# Patient Record
Sex: Female | Born: 1935 | Race: White | Hispanic: No | Smoking: Former smoker
Health system: Southern US, Community
[De-identification: ages and names within clinical notes are randomized; demographics above are authoritative.]

## PROBLEM LIST (undated history)

## (undated) HISTORY — PX: TONSILLECTOMY: SUR1361

---

## 1996-01-12 LAB — HM COLONOSCOPY: HM Colonoscopy: NORMAL

## 2005-01-11 HISTORY — PX: PACEMAKER INSERTION: SHX728

## 2005-01-11 HISTORY — PX: HIP PINNING: SHX1757

## 2006-10-18 ENCOUNTER — Ambulatory Visit: Payer: Self-pay | Admitting: Internal Medicine

## 2006-10-19 ENCOUNTER — Encounter: Payer: Self-pay | Admitting: Internal Medicine

## 2006-11-01 ENCOUNTER — Other Ambulatory Visit: Payer: Self-pay

## 2006-11-01 ENCOUNTER — Inpatient Hospital Stay: Payer: Self-pay | Admitting: Internal Medicine

## 2006-11-02 ENCOUNTER — Other Ambulatory Visit: Payer: Self-pay

## 2006-11-04 ENCOUNTER — Other Ambulatory Visit: Payer: Self-pay

## 2006-12-25 ENCOUNTER — Other Ambulatory Visit: Payer: Self-pay

## 2006-12-25 ENCOUNTER — Inpatient Hospital Stay: Payer: Self-pay | Admitting: Internal Medicine

## 2006-12-26 ENCOUNTER — Other Ambulatory Visit: Payer: Self-pay

## 2006-12-29 ENCOUNTER — Other Ambulatory Visit: Payer: Self-pay

## 2007-01-16 ENCOUNTER — Ambulatory Visit: Payer: Self-pay | Admitting: Cardiology

## 2007-02-01 ENCOUNTER — Ambulatory Visit: Payer: Self-pay | Admitting: Internal Medicine

## 2007-08-03 ENCOUNTER — Ambulatory Visit: Payer: Self-pay | Admitting: Internal Medicine

## 2008-02-15 ENCOUNTER — Ambulatory Visit: Payer: Self-pay | Admitting: Internal Medicine

## 2008-10-07 ENCOUNTER — Encounter: Payer: Self-pay | Admitting: Unknown Physician Specialty

## 2008-10-11 ENCOUNTER — Encounter: Payer: Self-pay | Admitting: Unknown Physician Specialty

## 2008-10-22 IMAGING — CT CT CHEST W/ CM
1 series · 16 of 32 positions shown, 20 images · IV contrast (agent unspecified)
Comparison: none

REASON FOR EXAM: hemoptysis
COMMENTS:

PROCEDURE:     CT  - CT CHEST WITH CONTRAST  - August 03, 2007  [DATE]
RESULT:
HISTORY: Hemoptysis.

[Series 2: soft tissue · axial · 0.54mm/px · z∈[-274,+10]mm · 16 of 65 slices shown, 20 images]
[im 5/65  soft-tissue]
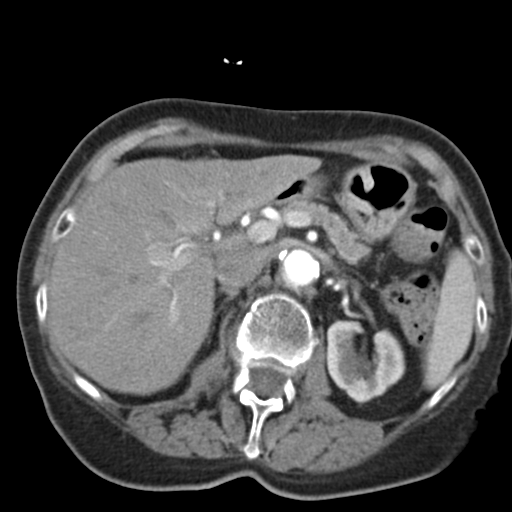
[im 5/65  bone]
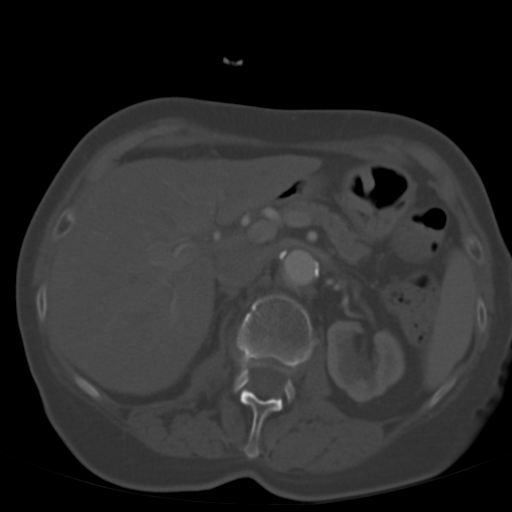
[im 9/65  soft-tissue]
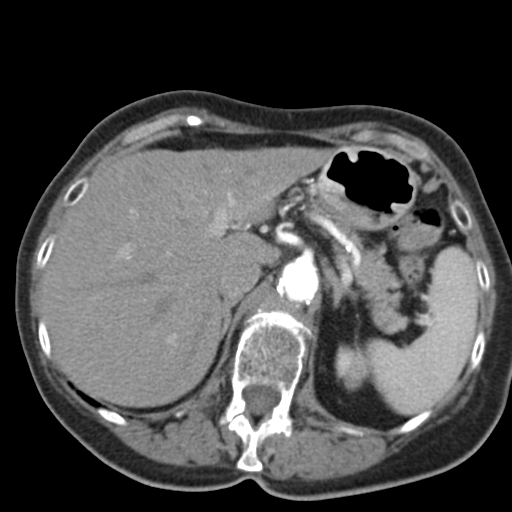
[im 13/65  soft-tissue]
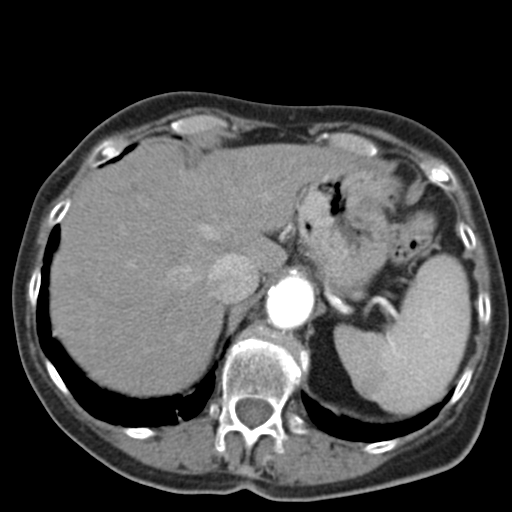
[im 17/65  soft-tissue]
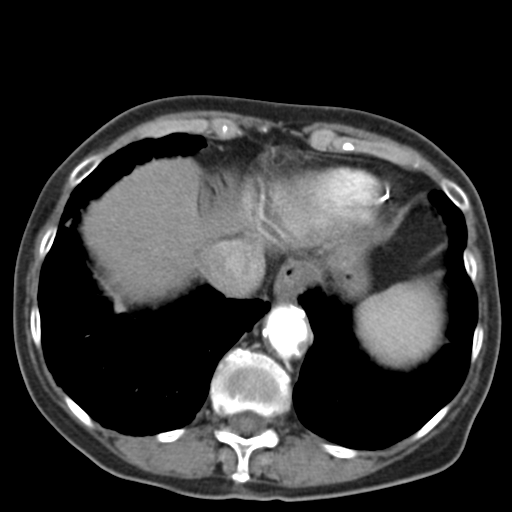
[im 21/65  soft-tissue]
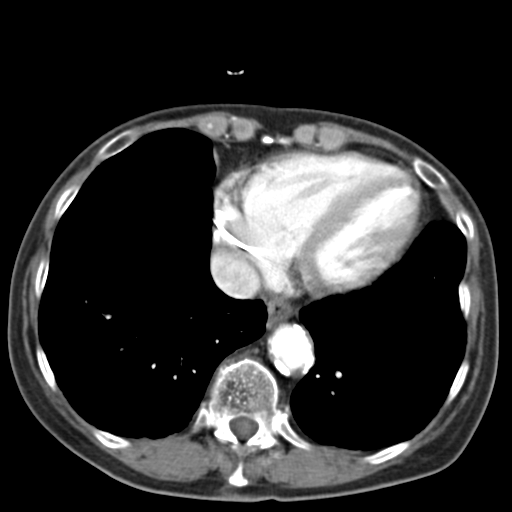
[im 25/65  soft-tissue]
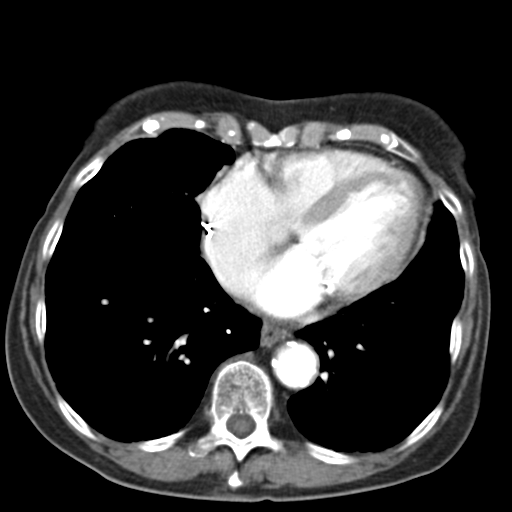
[im 29/65  soft-tissue]
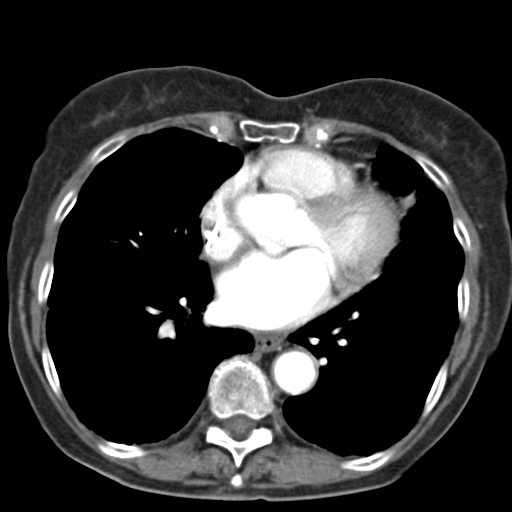
[im 36/65  soft-tissue]
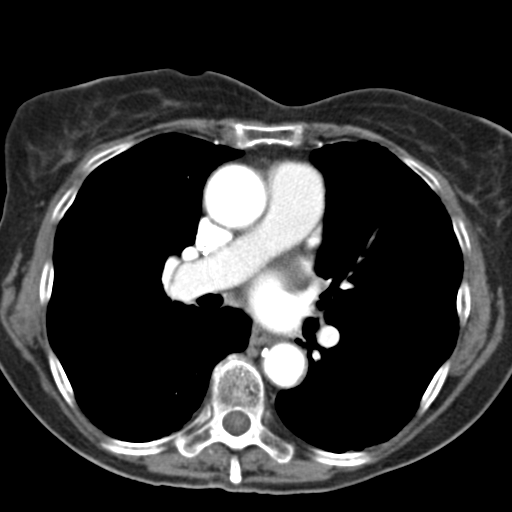
[im 40/65  soft-tissue]
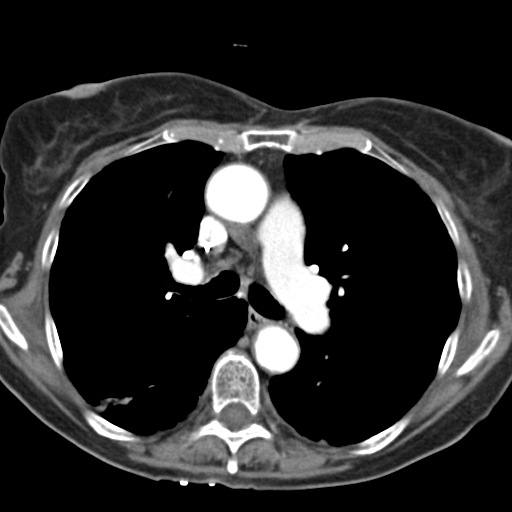
[im 40/65  bone]
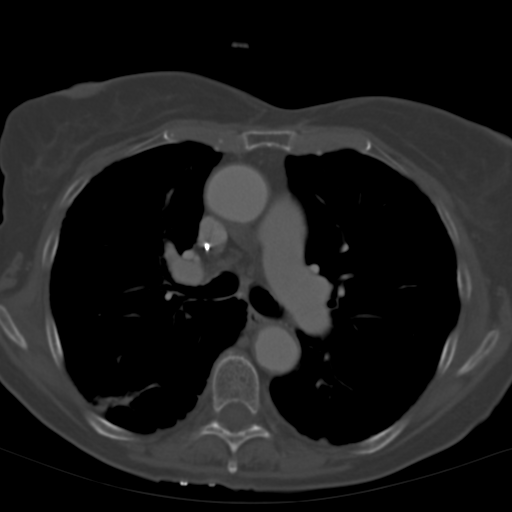
[im 44/65  soft-tissue]
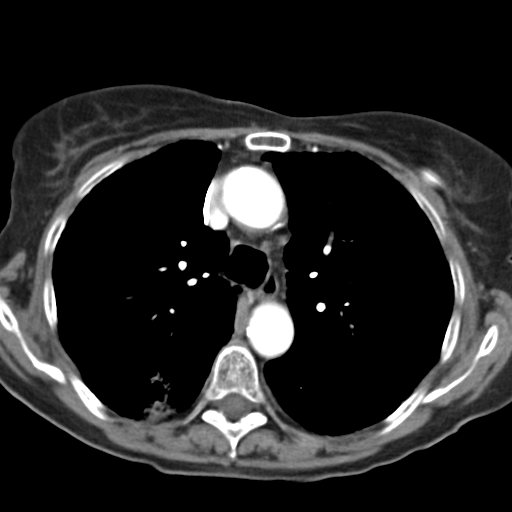
[im 48/65  soft-tissue]
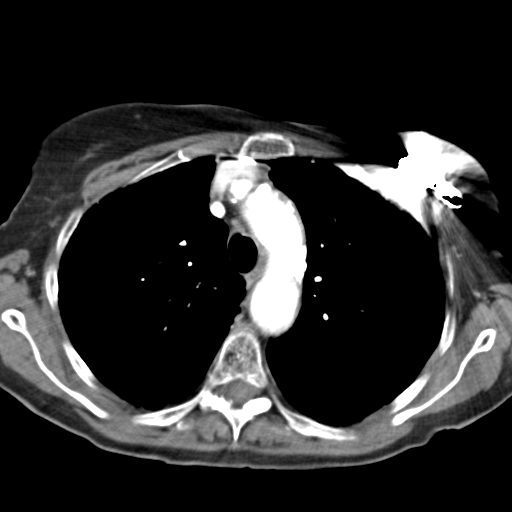
[im 52/65  soft-tissue]
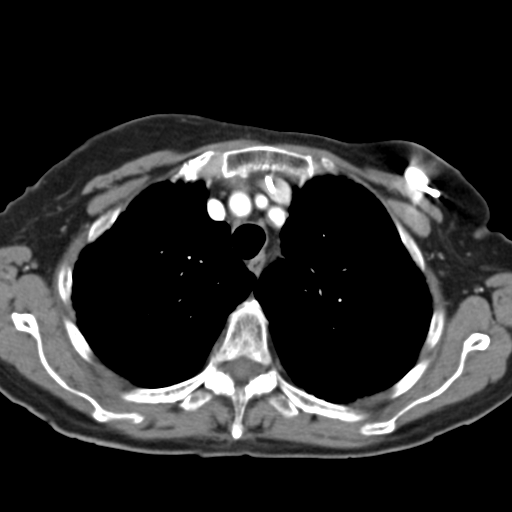
[im 56/65  soft-tissue]
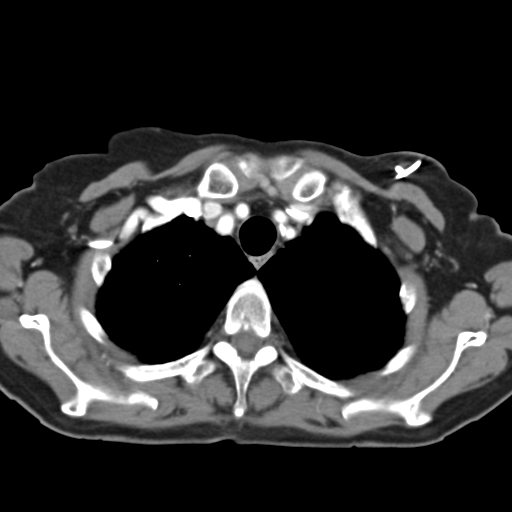
[im 56/65  lung]
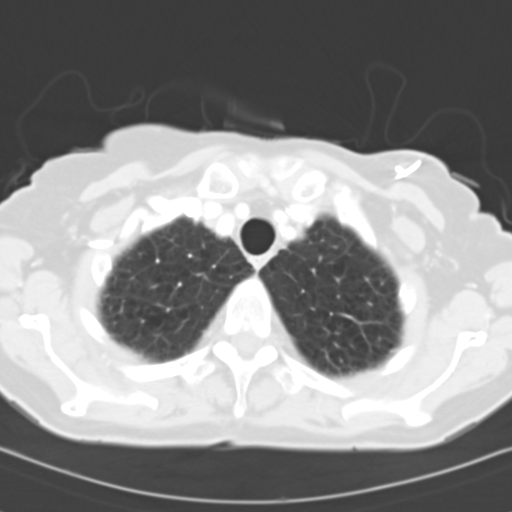
[im 58/65  lung]
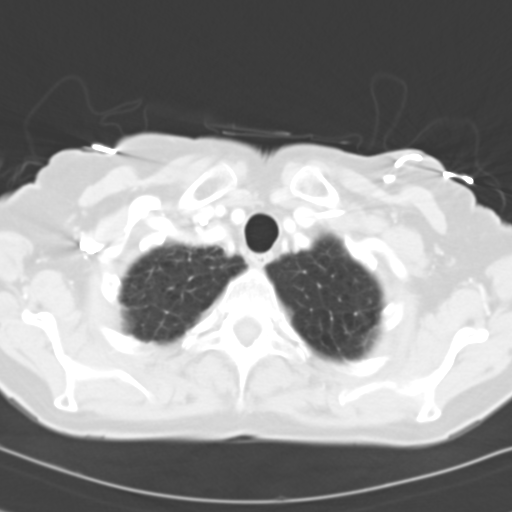
[im 60/65  soft-tissue]
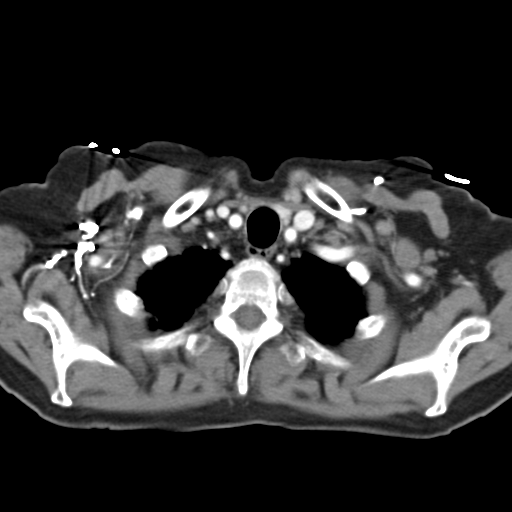
[im 60/65  lung]
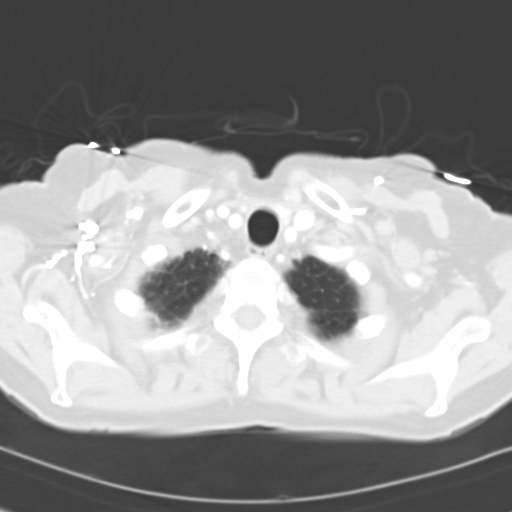
[im 62/65  lung]
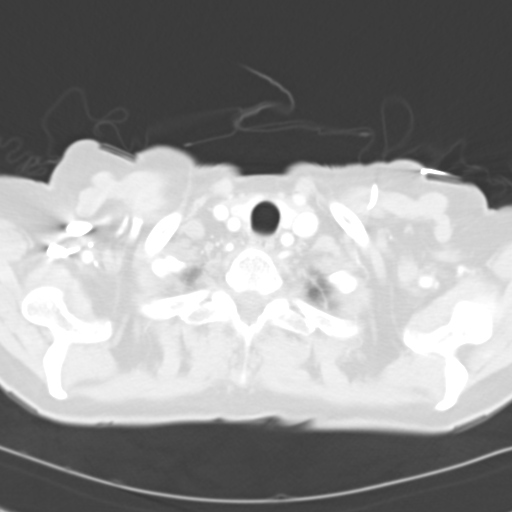

[16 of 32 positions shown; findings below may reference images not displayed]

COMPARISON STUDIES: Chest CT of 10/18/06.

PROCEDURE AND FINDINGS: Again noted are changes of bullous COPD.  Pleural
parenchymal thickening is noted consistent with scarring. Prominent cystic
change with adjacent interstitial prominence is again noted in the RIGHT
upper lobe. These changes are most consistent with bullous disease and
scarring. The changes are stable from 10/18/06. The thoracic aorta is
atherosclerotic.  Cardiac pacer noted with lead tips in the RIGHT ventricle.
The pulmonary arteries are normal.
IMPRESSION: 1.     No evidence of pulmonary embolus.
2.     Bullous COPD with stable changes of scarring in the RIGHT upper lobe.
Chest CT is stable from 10/18/06.
3.     A cardiac pacer is noted.

## 2008-11-11 ENCOUNTER — Encounter: Payer: Self-pay | Admitting: Unknown Physician Specialty

## 2008-12-11 ENCOUNTER — Encounter: Payer: Self-pay | Admitting: Unknown Physician Specialty

## 2009-01-11 ENCOUNTER — Encounter: Payer: Self-pay | Admitting: Unknown Physician Specialty

## 2009-02-11 ENCOUNTER — Encounter: Payer: Self-pay | Admitting: Unknown Physician Specialty

## 2009-05-20 ENCOUNTER — Ambulatory Visit: Payer: Self-pay | Admitting: Internal Medicine

## 2009-07-11 LAB — HM MAMMOGRAPHY: HM Mammogram: NORMAL

## 2009-12-08 ENCOUNTER — Emergency Department: Payer: Self-pay | Admitting: Emergency Medicine

## 2010-05-05 ENCOUNTER — Ambulatory Visit: Payer: Medicare Other | Admitting: Internal Medicine

## 2010-05-21 ENCOUNTER — Ambulatory Visit: Payer: Medicare Other | Admitting: Cardiothoracic Surgery

## 2010-05-21 ENCOUNTER — Ambulatory Visit: Payer: Medicare Other | Admitting: Specialist

## 2010-06-03 ENCOUNTER — Institutional Professional Consult (permissible substitution): Payer: Self-pay | Admitting: Emergency Medicine

## 2010-06-12 ENCOUNTER — Ambulatory Visit: Payer: Medicare Other | Admitting: Cardiothoracic Surgery

## 2010-06-17 ENCOUNTER — Ambulatory Visit: Payer: Medicare Other | Admitting: Oncology

## 2010-07-12 ENCOUNTER — Ambulatory Visit: Payer: Medicare Other | Admitting: Cardiothoracic Surgery

## 2010-07-16 ENCOUNTER — Encounter: Payer: Medicare Other | Admitting: Physical Medicine & Rehabilitation

## 2010-07-20 ENCOUNTER — Encounter: Payer: Self-pay | Admitting: Family Medicine

## 2010-07-20 ENCOUNTER — Ambulatory Visit (INDEPENDENT_AMBULATORY_CARE_PROVIDER_SITE_OTHER): Payer: Medicare Other | Admitting: Family Medicine

## 2010-07-20 DIAGNOSIS — D509 Iron deficiency anemia, unspecified: Secondary | ICD-10-CM | POA: Insufficient documentation

## 2010-07-20 DIAGNOSIS — M810 Age-related osteoporosis without current pathological fracture: Secondary | ICD-10-CM | POA: Insufficient documentation

## 2010-07-20 DIAGNOSIS — E039 Hypothyroidism, unspecified: Secondary | ICD-10-CM | POA: Insufficient documentation

## 2010-07-20 DIAGNOSIS — Z9289 Personal history of other medical treatment: Secondary | ICD-10-CM | POA: Insufficient documentation

## 2010-07-20 DIAGNOSIS — I6350 Cerebral infarction due to unspecified occlusion or stenosis of unspecified cerebral artery: Secondary | ICD-10-CM

## 2010-07-20 DIAGNOSIS — E538 Deficiency of other specified B group vitamins: Secondary | ICD-10-CM | POA: Insufficient documentation

## 2010-07-20 DIAGNOSIS — I4891 Unspecified atrial fibrillation: Secondary | ICD-10-CM | POA: Insufficient documentation

## 2010-07-20 DIAGNOSIS — D649 Anemia, unspecified: Secondary | ICD-10-CM

## 2010-07-20 DIAGNOSIS — IMO0002 Reserved for concepts with insufficient information to code with codable children: Secondary | ICD-10-CM

## 2010-07-20 DIAGNOSIS — N390 Urinary tract infection, site not specified: Secondary | ICD-10-CM

## 2010-07-20 DIAGNOSIS — I2510 Atherosclerotic heart disease of native coronary artery without angina pectoris: Secondary | ICD-10-CM | POA: Insufficient documentation

## 2010-07-20 DIAGNOSIS — I10 Essential (primary) hypertension: Secondary | ICD-10-CM | POA: Insufficient documentation

## 2010-07-20 DIAGNOSIS — I639 Cerebral infarction, unspecified: Secondary | ICD-10-CM | POA: Insufficient documentation

## 2010-07-20 DIAGNOSIS — H353290 Exudative age-related macular degeneration, unspecified eye, stage unspecified: Secondary | ICD-10-CM

## 2010-07-20 DIAGNOSIS — E7800 Pure hypercholesterolemia, unspecified: Secondary | ICD-10-CM

## 2010-07-20 DIAGNOSIS — C3490 Malignant neoplasm of unspecified part of unspecified bronchus or lung: Secondary | ICD-10-CM

## 2010-07-20 NOTE — Assessment & Plan Note (Signed)
Not evaluated in long time. Was previously on fosamax for years, taken off by MD.,

## 2010-07-20 NOTE — Assessment & Plan Note (Signed)
On aspirin daily. Likely high risk due to cancer.

## 2010-07-20 NOTE — Assessment & Plan Note (Signed)
Rate controlled on BBlocker. 

## 2010-07-20 NOTE — Progress Notes (Signed)
  Subjective:    Patient ID: Kristin Cantrell, female    DOB: 1921/08/31, 77 y.o.   MRN: [id number]  HPI 77 year old female here to establish new PCP.Marland Kitchen Previous MD at [facility].  Dx with lung cancer in 05/2010.Marland KitchenMarland KitchenSeeing Dr. Meredeth Ide Pulmonologist.  Now undergoing radiation only...radiation oncologist: Dr Aggie Cosier. Not surgical candidate but surgery also likely not necessary. On oxygen 2 Liters at night.  Had needed bx of mass on 5/10. Also had shot for wet macular degeneration on 5/11 Later that night  5/11 she had CVA... Admitted to [facility]. Status post rehab.  Since the stroke she has had some residual left foot weakness and some mild confusion.  Placed back on cholesterol medication when in hospital (had been taken off in past due to myalgia) No current SE to 20 mg  Simvastatin daily. Last LDL  IN 2000 she had MI. In 2007 had Atrial Fibrillation.. Pacemaker placed. (In past on amiodarone, but did not tolerate this) Dr Lady Gary in her cardiologist, next appt in July, sees him every 6 months. HTN, well controlled on atenolol.  Walks with walker   Review of Systems  Constitutional: Positive for fatigue. Negative for fever.  HENT: Negative for congestion.   Respiratory: Positive for shortness of breath. Negative for cough and wheezing.   Cardiovascular: Negative for chest pain and leg swelling.  Gastrointestinal: Positive for constipation. Negative for diarrhea.  Genitourinary: Negative for dysuria.  Musculoskeletal: Positive for back pain.  Skin: Negative for rash.  Neurological: Positive for light-headedness.       Associated with radiation       Objective:   Physical Exam  Constitutional: She is oriented to person, place, and time.       Elderly female in NAD   HENT:  Head: Normocephalic.  Right Ear: External ear normal.  Left Ear: External ear normal.  Eyes: Conjunctivae and EOM are normal. Pupils are equal, round, and reactive to light.  Neck: Normal range of motion.  Neck supple. No thyromegaly present.  Cardiovascular: Normal rate, S1 normal and S2 normal.  An irregular rhythm present. PMI is not displaced.  Exam reveals distant heart sounds. Exam reveals no friction rub.   No murmur heard.      B varicose veins, no peripheral swelling  Pulmonary/Chest: Effort normal. No respiratory distress. She has decreased breath sounds in the right upper field. She has no wheezes. She has no rhonchi. She has no rales.  Abdominal: Soft. Normal appearance and normal aorta. There is no hepatosplenomegaly. There is no tenderness. There is no CVA tenderness.  Musculoskeletal:       Slowed gait, walking with walker  Lymphadenopathy:    She has no cervical adenopathy.  Neurological: She is alert and oriented to person, place, and time. No cranial nerve deficit or sensory deficit. She exhibits normal muscle tone.  Skin: Skin is warm, dry and intact.  Psychiatric: She has a normal mood and affect. Her speech is normal and behavior is normal. Judgment and thought content normal. She exhibits abnormal recent memory. She exhibits normal remote memory.          Assessment & Plan:

## 2010-07-20 NOTE — Assessment & Plan Note (Signed)
Followed by Dr. Meredeth Ide and radiation oncology.  Plan is radiation only.

## 2010-07-20 NOTE — Patient Instructions (Addendum)
It was nice meeting you today. Restart calcium and vit D. Okay to use B complex if interested. Work on healthy eating rest and push fluids.

## 2010-07-20 NOTE — Assessment & Plan Note (Signed)
Stable per Dr. Lady Gary, cards. Will get records.

## 2010-07-20 NOTE — Assessment & Plan Note (Signed)
Back on simvastatin low dose. Due for re-eval in end of August.  Goal LDL <70.

## 2010-07-20 NOTE — Assessment & Plan Note (Signed)
Well-controlled on atenolol. 

## 2010-07-20 NOTE — Assessment & Plan Note (Signed)
Likely last checked in hospital. Get records for last eval.

## 2010-07-21 ENCOUNTER — Other Ambulatory Visit: Payer: Self-pay | Admitting: *Deleted

## 2010-07-21 MED ORDER — SULFAMETHOXAZOLE-TRIMETHOPRIM 400-80 MG PO TABS: 1.0000 | ORAL_TABLET | Freq: Every day | ORAL | Status: DC

## 2010-07-21 NOTE — Telephone Encounter (Signed)
Pt was seen yesterday as a new patient and forgot to tell you that she needs a refill on bactrim.  She takes this every night to prevent UTI's.  Uses [facility] in [place].

## 2010-07-21 NOTE — Telephone Encounter (Signed)
Bactrim regular strength

## 2010-07-21 NOTE — Telephone Encounter (Signed)
Before I refill this please verify the dose... Is it Bactrim regualr or DS... Med list says DS but DS not usually used for prophylaxisis.  Call pt daughter or pharm to verify.

## 2010-08-07 ENCOUNTER — Other Ambulatory Visit: Payer: Self-pay | Admitting: Family Medicine

## 2010-08-07 DIAGNOSIS — E039 Hypothyroidism, unspecified: Secondary | ICD-10-CM

## 2010-08-07 NOTE — Assessment & Plan Note (Signed)
T4 1.44 in hopsital.. Will recheck.

## 2010-08-12 ENCOUNTER — Ambulatory Visit: Payer: Medicare Other | Admitting: Cardiothoracic Surgery

## 2010-08-21 ENCOUNTER — Other Ambulatory Visit (INDEPENDENT_AMBULATORY_CARE_PROVIDER_SITE_OTHER): Payer: Medicare Other

## 2010-08-21 DIAGNOSIS — E039 Hypothyroidism, unspecified: Secondary | ICD-10-CM

## 2010-08-21 DIAGNOSIS — E7800 Pure hypercholesterolemia, unspecified: Secondary | ICD-10-CM

## 2010-08-21 LAB — COMPREHENSIVE METABOLIC PANEL
ALT: 11 U/L (ref 0–35)
Albumin: 3.9 g/dL (ref 3.5–5.2)
Alkaline Phosphatase: 57 U/L (ref 39–117)
Glucose, Bld: 91 mg/dL (ref 70–99)
Potassium: 4.4 mEq/L (ref 3.5–5.1)
Sodium: 142 mEq/L (ref 135–145)
Total Protein: 6.6 g/dL (ref 6.0–8.3)

## 2010-08-21 LAB — LIPID PANEL: VLDL: 21.6 mg/dL (ref 0.0–40.0)

## 2010-08-21 LAB — TSH: TSH: 1.21 u[IU]/mL (ref 0.35–5.50)

## 2010-08-28 ENCOUNTER — Ambulatory Visit: Payer: Self-pay | Admitting: Family Medicine

## 2010-08-28 ENCOUNTER — Ambulatory Visit (INDEPENDENT_AMBULATORY_CARE_PROVIDER_SITE_OTHER): Payer: Medicare Other | Admitting: Family Medicine

## 2010-08-28 ENCOUNTER — Encounter: Payer: Self-pay | Admitting: Family Medicine

## 2010-08-28 DIAGNOSIS — E7800 Pure hypercholesterolemia, unspecified: Secondary | ICD-10-CM

## 2010-08-28 DIAGNOSIS — C3490 Malignant neoplasm of unspecified part of unspecified bronchus or lung: Secondary | ICD-10-CM

## 2010-08-28 DIAGNOSIS — N390 Urinary tract infection, site not specified: Secondary | ICD-10-CM

## 2010-08-28 DIAGNOSIS — I10 Essential (primary) hypertension: Secondary | ICD-10-CM

## 2010-08-28 NOTE — Patient Instructions (Addendum)
Call if appetite not improving in 1 month or more if weight loss continuing or appetitie remains poor. Consider remeron to help with appetite. Continue PT at hospital, work on home exercises as able.

## 2010-08-28 NOTE — Assessment & Plan Note (Signed)
Almost at goal LDL ,70, pt does not wish to be aggressive. Will continue current lovastatin low dose.

## 2010-08-28 NOTE — Progress Notes (Signed)
Subjective:    Patient ID: Kristin Cantrell, female    DOB: 1921/09/17, 77 y.o.   MRN: [id number]  HPI  Dx with lung cancer in 05/2010.Marland KitchenMarland KitchenSeeing Dr. Meredeth Ide Pulmonologist.  Now undergoing radiation only...radiation oncologist: Dr Aggie Cosier.  Not surgical candidate but surgery also likely not necessary.  On oxygen 2 Liters at night.  She did have her last rediation treatment today.. Tolerating fairly well. Stable SOB.  She has been having poor appetitie. Still trys to eat. Weight has dropped in last 7 lbs.  Sh wants to minimize meds as much as possible.  Had needed bx of mass on 5/10.  Also had shot for wet macular degeneration on 5/11  Later that night 5/11 she had CVA... Admitted to [facility].  Status post rehab.  Since the stroke she has had some residual left foot weakness and some mild confusion.   Placed back on cholesterol medication when in hospital (had been taken off in past due to myalgia) No current SE to 20 mg Simvastatin daily.  LDL almost at goal <70 on recent tests 08/2010  IN 2000 she had MI.  In 2007 had Atrial Fibrillation.. Pacemaker placed. (In past on amiodarone, but did not tolerate this)  Dr Lady Gary in her cardiologist, next appt in July, sees him every 6 months.   HTN, well controlled on atenolol.  Using medication without problems or lightheadedness:  Chest pain with exertion: None Edema:None Short of breath:Stable. Average home BPs: Other issues:  Hypothyroid: Lab Results  Component Value Date   TSH 1.21 08/21/2010         Review of Systems  Constitutional: Negative for fever and fatigue.  HENT: Positive for ear pain. Negative for ear discharge.        Right occ intermittant in past  few months  Eyes: Negative for pain.  Respiratory: Negative for chest tightness and shortness of breath.   Cardiovascular: Negative for chest pain, palpitations and leg swelling.  Gastrointestinal: Negative for abdominal pain.  Genitourinary: Negative for dysuria.       Objective:   Physical Exam  Constitutional: Vital signs are normal. She appears well-developed and well-nourished. She is cooperative.  Non-toxic appearance. She does not appear ill. No distress.       Elderly, in wheelchair  HENT:  Head: Normocephalic.  Right Ear: Hearing, tympanic membrane, external ear and ear canal normal. Tympanic membrane is not erythematous, not retracted and not bulging.  Left Ear: Hearing, tympanic membrane, external ear and ear canal normal. Tympanic membrane is not erythematous, not retracted and not bulging.  Nose: No mucosal edema or rhinorrhea. Right sinus exhibits no maxillary sinus tenderness and no frontal sinus tenderness. Left sinus exhibits no maxillary sinus tenderness and no frontal sinus tenderness.  Mouth/Throat: Uvula is midline, oropharynx is clear and moist and mucous membranes are normal.       Right TM occluded with cerumen.Murlean Caller and exam reperformed  Eyes: Conjunctivae, EOM and lids are normal. Pupils are equal, round, and reactive to light. No foreign bodies found.  Neck: Trachea normal and normal range of motion. Neck supple. Carotid bruit is not present. No mass and no thyromegaly present.  Cardiovascular: Normal rate, regular rhythm, S1 normal, S2 normal, intact distal pulses and normal pulses.  Exam reveals distant heart sounds. Exam reveals no gallop and no friction rub.   No murmur heard. Pulmonary/Chest: Effort normal and breath sounds normal. Not tachypneic. No respiratory distress. She has no decreased breath sounds. She has no wheezes. She  has no rhonchi. She has no rales.  Abdominal: Soft. Normal appearance and bowel sounds are normal. There is no tenderness.  Neurological: She is alert.  Skin: Skin is warm, dry and intact. No rash noted.  Psychiatric: Her speech is normal and behavior is normal. Judgment and thought content normal. Her mood appears not anxious. Cognition and memory are normal. She does not exhibit a depressed  mood.          Assessment & Plan:

## 2010-08-28 NOTE — Assessment & Plan Note (Signed)
Stable control. 

## 2010-08-28 NOTE — Assessment & Plan Note (Signed)
S/P last radiation treatment. Followed by Dr. Meredeth Ide and radiation oncologist.

## 2010-08-28 NOTE — Assessment & Plan Note (Signed)
Stable on BActrim, last UTI in approximately 07/2009, but she wishes to try to come off this medication.

## 2010-09-01 ENCOUNTER — Encounter: Payer: Medicare Other | Admitting: Physical Medicine & Rehabilitation

## 2010-09-12 ENCOUNTER — Ambulatory Visit: Payer: Medicare Other | Admitting: Cardiothoracic Surgery

## 2010-10-12 ENCOUNTER — Ambulatory Visit: Payer: Medicare Other | Admitting: Cardiothoracic Surgery

## 2010-11-05 ENCOUNTER — Ambulatory Visit (INDEPENDENT_AMBULATORY_CARE_PROVIDER_SITE_OTHER): Payer: Medicare Other | Admitting: Family Medicine

## 2010-11-05 ENCOUNTER — Encounter: Payer: Self-pay | Admitting: Family Medicine

## 2010-11-05 DIAGNOSIS — G8190 Hemiplegia, unspecified affecting unspecified side: Secondary | ICD-10-CM

## 2010-11-05 DIAGNOSIS — I10 Essential (primary) hypertension: Secondary | ICD-10-CM

## 2010-11-05 DIAGNOSIS — G40909 Epilepsy, unspecified, not intractable, without status epilepticus: Secondary | ICD-10-CM

## 2010-11-05 DIAGNOSIS — I4891 Unspecified atrial fibrillation: Secondary | ICD-10-CM

## 2010-11-05 DIAGNOSIS — R0902 Hypoxemia: Secondary | ICD-10-CM | POA: Insufficient documentation

## 2010-11-05 DIAGNOSIS — G8384 Todd's paralysis (postepileptic): Secondary | ICD-10-CM | POA: Insufficient documentation

## 2010-11-05 DIAGNOSIS — C3490 Malignant neoplasm of unspecified part of unspecified bronchus or lung: Secondary | ICD-10-CM

## 2010-11-05 NOTE — Assessment & Plan Note (Signed)
On keppra. Follow up with neuro pending.

## 2010-11-05 NOTE — Progress Notes (Deleted)
Ambulatory Pulse Oximetry:  Resting HR__   O2 Sat__  Walk HR__   O2 Sat__ Walk HR__   O2 Sat__ Walk HR__   O2 Sat__  __Test completed without difficulty __Test stopped due to: 

## 2010-11-05 NOTE — Assessment & Plan Note (Signed)
Given worsening shortness of breath ( although no clear sign of infection, stable lung exam).. Will recommend re-eval with Dr. Meredeth Ide.  We dod not have his records yet and will try to re-obtain.

## 2010-11-05 NOTE — Patient Instructions (Signed)
Mucinex, no decongestant to thin mucus. We will work on getting oxygen set up. Make appt for follow up with Dr. Meredeth Ide in next few weeks for worsening of shortness of breath.

## 2010-11-05 NOTE — Assessment & Plan Note (Signed)
New requirement for continuous oxygen.  Pt qualifies per Medicare standards.  Referral made.

## 2010-11-05 NOTE — Assessment & Plan Note (Signed)
Well controlled. Continue current medication.  

## 2010-11-05 NOTE — Progress Notes (Signed)
Subjective:    Patient ID: Kristin Cantrell, female    DOB: [birth year], 77 y.o.   MRN: [id number]  HPI  77 year old female with lung cancer, CAD, past history of CVA in 05/2010 (after bx lung mass and mac deg injection, left side body residual weakness), HTN presents for hospital follow up. She was admitted to [facility] on Oct 5 for fall.Marland Kitchen Speech garbled suddenly, right side of body weak, confusion. Called EMS.  Given TPSA for presumed stroke... But weakness resolved immediately next day, but off and on speech issues and confusion.. CT scan x 3 was stable.  EEG; showed ? 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 Placed on keppra. She should be having a follow up neuro appt soon.  Was on oxygen in hospital.  Sent home on 10/12 with [facility].. OT, PT< RN..Occupational Therapy noted oxygen was 88%... Confusion improved dramatically.   NO discharge summary for review at this time.. Requested, hx per daughter.   Recent history includes: Dx with lung cancer in 05/2010.Marland KitchenMarland KitchenSeeing Dr. Meredeth Ide Pulmonologist.  Completed radiation only...radiation oncologist: Dr Aggie Cosier. Has follow up in 6 months for CT scan of lung. Not surgical candidate but surgery also likely not necessary.   Since discharge: On oxygen 2 Liters at night currently but now needing O2 continuously. Shortness of breath worse since hospitalization. No chest pain, no edema. No fever. Coughing productive, clear:somewhat worse in last few weeks. Thick. TODAY: Oxygen 99 percent on no oxygen at rest, dropped to 62 percent with walking short distance, back up to 97 % on oxygen.   As evidenced by events leading up to recent hospitalization she needs a health alert system. She currently has life alert, but did not have presence of mind to press button when she had seizures and confusion.  Needs 24 hour care, she needs to  relinquish life alert contract.  [phone] fax.     Review of Systems  Constitutional: Negative for fever and fatigue.  HENT: Negative for ear pain.   Eyes: Negative for pain.  Respiratory: Positive for cough and shortness of breath. Negative for chest tightness and wheezing.   Cardiovascular: Negative for chest pain, palpitations and leg swelling.  Gastrointestinal: Negative for abdominal pain and blood in stool.  Genitourinary: Negative for dysuria.       Objective:   Physical Exam  Constitutional: She appears well-nourished. No distress.       Thin appearing female in wheel chair in NAD, when walking around office very short of breath  HENT:  Head: Normocephalic and atraumatic.  Right Ear: External ear normal.  Left Ear: External ear normal.  Nose: Nose normal.  Mouth/Throat: Oropharynx is clear and moist. No oropharyngeal exudate.  Eyes: Conjunctivae and EOM are normal. Pupils are equal, round, and reactive to light. Right eye exhibits no discharge. Left eye exhibits no discharge.  Neck: Normal range of motion. Neck supple.  Cardiovascular: Normal rate, regular rhythm, normal heart sounds and intact distal pulses.  Exam reveals no gallop and no friction rub.   No murmur heard. Pulmonary/Chest: Effort normal. She has decreased breath sounds in the right upper field. She has no wheezes. She has no rhonchi. She has no rales.  Abdominal: Soft. Normal appearance, normal aorta and bowel sounds are normal. There is no tenderness. There is no rebound.  Skin: Skin is warm. No rash noted. She  is not diaphoretic. No pallor.  Psychiatric: She has a normal mood and affect. Her speech is normal and behavior is normal. Thought content normal. Cognition and memory are normal.          Assessment & Plan:

## 2010-11-05 NOTE — Assessment & Plan Note (Signed)
Rate controlled 

## 2010-12-10 ENCOUNTER — Encounter: Payer: Self-pay | Admitting: Family Medicine

## 2010-12-10 ENCOUNTER — Ambulatory Visit (INDEPENDENT_AMBULATORY_CARE_PROVIDER_SITE_OTHER): Payer: Medicare Other | Admitting: Family Medicine

## 2010-12-10 VITALS — BP 90/52 | HR 60 | Temp 97.2°F | Ht 63.75 in | Wt 125.0 lb

## 2010-12-10 DIAGNOSIS — C3490 Malignant neoplasm of unspecified part of unspecified bronchus or lung: Secondary | ICD-10-CM

## 2010-12-10 DIAGNOSIS — I4891 Unspecified atrial fibrillation: Secondary | ICD-10-CM

## 2010-12-10 DIAGNOSIS — R5381 Other malaise: Secondary | ICD-10-CM

## 2010-12-10 DIAGNOSIS — M6250 Muscle wasting and atrophy, not elsewhere classified, unspecified site: Secondary | ICD-10-CM

## 2010-12-10 DIAGNOSIS — R0602 Shortness of breath: Secondary | ICD-10-CM

## 2010-12-10 DIAGNOSIS — R0902 Hypoxemia: Secondary | ICD-10-CM

## 2010-12-10 NOTE — Assessment & Plan Note (Signed)
Was unable to participate fully in PT fpollowing hospitalization since she was not on oxygen at this time. Weakness diffusely.. Pt would benefit from PT referral now that her breathing status is more stable.

## 2010-12-10 NOTE — Assessment & Plan Note (Signed)
Desaturation earlier today.. likely due to pt not wearing oxygen and ambulating. No new sign of infection.  Lung exam clear.  Pt encouraged to keep oxygen on daily.

## 2010-12-10 NOTE — Progress Notes (Signed)
Subjective:    Patient ID: Kristin Cantrell, female    DOB: 03/27/1921, 77 y.o.   MRN: [id number]  HPI   77 year old female with lung cancer, CAD, past history of CVA in 05/2010 (after bx lung mass and mac deg injection, left side body residual weakness), HTN HERE FOR 3 MONTH FOLLOW UP.  She was admitted to [facility] on Oct 5 for fall.Marland Kitchen Speech garbled suddenly, right side of body weak, confusion. Called EMS.  Given TPSA for presumed stroke... But weakness resolved immediately next day, but off and on speech issues and confusion.. CT scan x 3 was stable. EEG; showed ? 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 Placed on keppra.  No changes with seizure med at this time per NEURO. Fatigue had improved with O2. Was on oxygen in hospital.  Sent home on 10/12 with [name].. OT, PT< RN..Occupational Therapy noted oxygen was 88%... Confusion improved dramatically.   Recent history includes:  Dx with lung cancer in 05/2010.Marland KitchenMarland KitchenSeeing Dr. Meredeth Ide Pulmonologist.  Completed radiation only...radiation oncologist: Dr Aggie Cosier. Has follow up in 6 months for CT scan of lung.  Not surgical candidate but surgery also likely not necessary.  Last OV was in 10/2010 with Dr. Meredeth Ide... CXR showed no mass at that time.  Since last hospitalization:  On oxygen 2 Liters  continuously.   Awoke this AM feeling poorly, as she was trying to get to kitchen (had oxygen off at that time), felt lightheaded.This AM desat to 50-60 until increase oxygen to 3 Liters.  Now back to 2 Liter Heart rate this A with afib was normal but irregular.  No chest pain, no edema. No fever.  Having a lot of mucus production, clear, thick. Using mucinex daily.  Sees Dr. Edwin Cap Cardiologist... Sees every 6 months.   Wants to be more active... Deconditioned, weak.. Feel she would benefit frpom futher PT.  Cannot bathes  self.... Has aid.Marland Kitchen [facility] [place] [facility] that they are paying out of pocket.  (family and pt instructed this is not covered by insurance)  Daughter doing pill cases. No skilled nursing needed  Oxygen is not as portable as she needs.. Can this be smaller.  Not currently interested in hospice.       Review of Systems  Constitutional: Positive for fatigue.  HENT: Negative for ear pain.   Eyes: Negative for pain.  Respiratory: Positive for cough and shortness of breath. Negative for wheezing.   Cardiovascular: Negative for chest pain, palpitations and leg swelling.  Gastrointestinal: Negative for diarrhea, constipation, blood in stool and anal bleeding.  Genitourinary: Negative for dysuria.  Psychiatric/Behavioral: Negative for confusion.       Objective:   Physical Exam  Constitutional: She is oriented to person, place, and time. She appears well-nourished. No distress.       Thin appearing female in wheel chair in NAD,on oxygen Frail appearing.  HENT:  Head: Normocephalic and atraumatic.  Right Ear: External ear normal.  Left Ear: External ear normal.  Nose: Nose normal.  Mouth/Throat: Oropharynx is clear and moist. No oropharyngeal exudate.  Eyes: Conjunctivae and EOM are normal. Pupils are equal, round, and reactive to light. Right eye exhibits no discharge. Left eye exhibits no discharge.  Neck: Normal range of motion. Neck supple.  Cardiovascular: Normal rate, regular rhythm, normal heart sounds and intact distal pulses.  Exam reveals  no gallop and no friction rub.   No murmur heard. Pulmonary/Chest: Effort normal. She has decreased breath sounds in the right upper field. She has no wheezes. She has no rhonchi. She has no rales.  Abdominal: Soft. Normal appearance, normal aorta and bowel sounds are normal. There is no tenderness. There is no rebound.  Musculoskeletal:       strength 4/5 throughout  Neurological: She is alert and oriented to person, place, and time. She has  normal reflexes.  Skin: Skin is warm. No rash noted. She is not diaphoretic. No pallor.  Psychiatric: She has a normal mood and affect. Her speech is normal and behavior is normal. Thought content normal. Cognition and memory are normal.          Assessment & Plan:

## 2010-12-10 NOTE — Assessment & Plan Note (Signed)
Stable with pacemaker in place. Recommended keeping up with cardiology follow up.

## 2010-12-10 NOTE — Patient Instructions (Signed)
Will call you about home health issues ( PT referral, oxygen to two to THREE liters, and more portable oxygen) Follow up appt in 3 months, call sooner if new changes.

## 2010-12-10 NOTE — Assessment & Plan Note (Addendum)
Per recent CXR at Dr. Reita Cliche office... Mass has disappeared.  Pt still with oxygen requirement.  We will forward daughters question of ability to increase to 3 Liters oxygen as needed to Dr. Meredeth Ide. Will have MArion  Look into whether more portable oxygen is available.  Discussed limited options for home care other than self pay as she is doing now... Discussed hospice referral as option.. Pt declined at this point.

## 2010-12-11 ENCOUNTER — Telehealth: Payer: Self-pay | Admitting: *Deleted

## 2010-12-11 NOTE — Progress Notes (Signed)
Addended byKerby Nora E on: 12/11/2010 01:50 PM   Modules accepted: Orders

## 2010-12-11 NOTE — Telephone Encounter (Signed)
Daughter called to inform you pt was treated in past by [facility] phone # [phone]. The Occupational Therapist in charge was named Hilda Lias.

## 2010-12-16 ENCOUNTER — Telehealth: Payer: Self-pay | Admitting: Internal Medicine

## 2010-12-16 NOTE — Telephone Encounter (Signed)
Ms. Mason Jim called from [facility] and needs a verbal order for skilled nursing to evaluate and treat.  Please advise.  Ph: [phone]

## 2010-12-16 NOTE — Telephone Encounter (Signed)
Patient's daughter called and stated in order to discontinue to [facility] service the cancel order has to be on a script or on a letterhead and ask if you could fax this when it's done.  Please advise.

## 2010-12-17 NOTE — Telephone Encounter (Signed)
Faxed to company

## 2010-12-17 NOTE — Telephone Encounter (Signed)
IN outbox 

## 2010-12-22 ENCOUNTER — Other Ambulatory Visit: Payer: Self-pay | Admitting: *Deleted

## 2010-12-22 MED ORDER — LEVETIRACETAM 750 MG PO TABS: 750.0000 mg | ORAL_TABLET | Freq: Two times a day (BID) | ORAL | Status: DC

## 2010-12-22 NOTE — Telephone Encounter (Signed)
Needs refill

## 2011-01-10 ENCOUNTER — Other Ambulatory Visit: Payer: Self-pay | Admitting: Family Medicine

## 2011-01-22 ENCOUNTER — Telehealth: Payer: Self-pay | Admitting: Internal Medicine

## 2011-01-22 NOTE — Telephone Encounter (Signed)
Ezra Sites PT from [facility] called and stated she needs a verbal order to extend for her gait and balance for 2 weeks and twice a week If ok we can give a verbal order.   Phone: [phone]

## 2011-01-22 NOTE — Telephone Encounter (Signed)
Please okay verbal order

## 2011-01-25 NOTE — Telephone Encounter (Signed)
Verbal order given  

## 2011-01-28 ENCOUNTER — Ambulatory Visit: Payer: Self-pay | Admitting: Oncology

## 2011-01-29 DIAGNOSIS — C3490 Malignant neoplasm of unspecified part of unspecified bronchus or lung: Secondary | ICD-10-CM

## 2011-01-29 DIAGNOSIS — I69998 Other sequelae following unspecified cerebrovascular disease: Secondary | ICD-10-CM

## 2011-01-29 DIAGNOSIS — R269 Unspecified abnormalities of gait and mobility: Secondary | ICD-10-CM

## 2011-01-29 DIAGNOSIS — M6281 Muscle weakness (generalized): Secondary | ICD-10-CM

## 2011-01-29 DIAGNOSIS — IMO0001 Reserved for inherently not codable concepts without codable children: Secondary | ICD-10-CM

## 2011-02-04 ENCOUNTER — Ambulatory Visit: Payer: Self-pay | Admitting: Oncology

## 2011-02-04 LAB — CBC CANCER CENTER
Basophil #: 0 x10 3/mm (ref 0.0–0.1)
Eosinophil #: 0.1 x10 3/mm (ref 0.0–0.7)
HGB: 12.1 g/dL (ref 12.0–16.0)
Lymphocyte %: 15.3 %
MCHC: 33.5 g/dL (ref 32.0–36.0)
MCV: 92 fL (ref 80–100)
Monocyte %: 10.3 %
Neutrophil %: 72.3 %
RDW: 14.5 % (ref 11.5–14.5)
WBC: 5.3 x10 3/mm (ref 3.6–11.0)

## 2011-02-04 LAB — COMPREHENSIVE METABOLIC PANEL
Albumin: 3.5 g/dL (ref 3.4–5.0)
Anion Gap: 6 — ABNORMAL LOW (ref 7–16)
Calcium, Total: 8.6 mg/dL (ref 8.5–10.1)
Chloride: 104 mmol/L (ref 98–107)
Co2: 31 mmol/L (ref 21–32)
EGFR (African American): >60
EGFR (Non-African Amer.): 54 — ABNORMAL LOW
Glucose: 91 mg/dL (ref 65–99)
Potassium: 4 mmol/L (ref 3.5–5.1)
SGOT(AST): 16 U/L (ref 15–37)
Sodium: 141 mmol/L (ref 136–145)

## 2011-02-12 ENCOUNTER — Ambulatory Visit: Payer: Self-pay | Admitting: Oncology

## 2011-04-29 ENCOUNTER — Encounter: Payer: Self-pay | Admitting: Family Medicine

## 2011-04-29 ENCOUNTER — Ambulatory Visit (INDEPENDENT_AMBULATORY_CARE_PROVIDER_SITE_OTHER): Payer: Medicare Other | Admitting: Family Medicine

## 2011-04-29 VITALS — BP 110/72 | HR 61 | Temp 97.5°F | Wt 118.8 lb

## 2011-04-29 DIAGNOSIS — R531 Weakness: Secondary | ICD-10-CM

## 2011-04-29 DIAGNOSIS — K5289 Other specified noninfective gastroenteritis and colitis: Secondary | ICD-10-CM

## 2011-04-29 DIAGNOSIS — R630 Anorexia: Secondary | ICD-10-CM

## 2011-04-29 DIAGNOSIS — E860 Dehydration: Secondary | ICD-10-CM

## 2011-04-29 DIAGNOSIS — R5383 Other fatigue: Secondary | ICD-10-CM

## 2011-04-29 DIAGNOSIS — K529 Noninfective gastroenteritis and colitis, unspecified: Secondary | ICD-10-CM

## 2011-04-29 LAB — POCT URINALYSIS DIPSTICK
Blood, UA: NEGATIVE
Glucose, UA: NEGATIVE
Nitrite, UA: NEGATIVE
Urobilinogen, UA: NEGATIVE

## 2011-04-29 MED ORDER — ONDANSETRON 8 MG PO TBDP: 8.0000 mg | ORAL_TABLET | Freq: Three times a day (TID) | ORAL | Status: AC | PRN

## 2011-04-29 MED ORDER — MEGESTROL ACETATE 40 MG PO TABS: 400.0000 mg | ORAL_TABLET | Freq: Every day | ORAL | Status: DC

## 2011-04-29 NOTE — Progress Notes (Signed)
Patient Name: Abrey Bradway Date of Birth: 01-17-1921 Medical Record Number: [id number] Gender: female Date of Encounter: 04/29/2011  History of Present Illness:  Kristin Cantrell is a 78 y.o. very pleasant female patient who presents with the following:  A week ago tomorrow, then got lightheaded and had a hard time getting up and had some syncope r presyncope. Had some nausea, vomitted a few times. Vomitted up a half a cup and was spitting up. No diarrhea. Stayed in bed for a couple of days. Still feels weak. Not drinking or eating well.  No stomache pain, occ aching. Has not been wanting to eat for a year. Longstanding problem.  Wt Readings from Last 3 Encounters:  04/29/11 118 lb 12.8 oz (53.887 kg)  12/10/10 125 lb (56.7 kg)  11/05/10 120 lb 4 oz (54.545 kg)   Some weight loss  Patient Active Problem List  Diagnoses  . Lung cancer  . Macular degeneration, wet  . CVA (cerebral vascular accident)  . HTN (hypertension)  . High cholesterol  . Hypothyroid  . CAD (coronary artery disease)  . Atrial fibrillation  . HX of  iron def Anemia  . Hx of B12 deficiency  . History of TB skin testing  . Recurrent UTI  . DDD (degenerative disc disease)  . Osteoporosis  . Hypoxia  . Seizure disorder  . Todd's paralysis  . Physical deconditioning   No past medical history on file. Past Surgical History  Procedure Date  . Hip pinning 2007     left hip after fall  . Pacemaker insertion 2007  . Tonsillectomy    History  Substance Use Topics  . Smoking status: Former Smoker -- 40 years  . Smokeless tobacco: Never Used  . Alcohol Use: No   Family History  Problem Relation Age of Onset  . Stroke Mother   . Cancer Mother     breast and colon cancer  . Heart disease Father   . Hypertension Father   . Hyperlipidemia Father   . Cancer Sister     ovarian and uterine cancer  . Heart disease Brother 47    died  . Cancer Brother     prostate cancer   Allergies  Allergen Reactions   . Codeine Nausea Only    Medication list has been reviewed and updated.  Review of Systems: As above, weakness, n, v, no diarrhea, ? urgency  Physical Examination: Filed Vitals:   04/29/11 1110  BP: 110/72  Pulse: 61  Temp: 97.5 F (36.4 C)  TempSrc: Oral  Weight: 118 lb 12.8 oz (53.887 kg)  SpO2: 98%    There is no height on file to calculate BMI.   GEN: WDWN, NAD, Non-toxic, A & O x 3 HEENT: Atraumatic, Normocephalic. Neck supple. No masses, No LAD. Ears and Nose: No external deformity. CV: RRR, No M/G/R. No JVD. No thrill. No extra heart sounds. PULM: CTA B, no wheezes, crackles, rhonchi. No retractions. No resp. distress. No accessory muscle use. EXTR: No c/c/e NEURO in wheelchair PSYCH: Normally interactive. Conversant. Not depressed or anxious appearing.  Calm demeanor.    Assessment and Plan:  1. Weakness  POCT Urinalysis Dipstick  2. Gastroenteritis    3. Dehydration    4. Poor appetite     Recovering from virus, suspect mostly from dehyrdation. Push fluids  Poor appetite for > 1 year, d/w patient and daughter, and they are interested in appetite stimulant. Start megace  Orders Today: Orders Placed This Encounter  Procedures  .  POCT Urinalysis Dipstick    Medications Today: Meds ordered this encounter  Medications  . ondansetron (ZOFRAN-ODT) 8 MG disintegrating tablet    Sig: Take 1 tablet (8 mg total) by mouth every 8 (eight) hours as needed for nausea.    Dispense:  30 tablet    Refill:  5  . megestrol (MEGACE) 40 MG tablet    Sig: Take 10 tablets (400 mg total) by mouth daily.    Dispense:  300 tablet    Refill:  5

## 2011-05-05 ENCOUNTER — Telehealth: Payer: Self-pay

## 2011-05-05 NOTE — Telephone Encounter (Signed)
Pt received letter from [facility] recertification of O2 needs form filled out. Herbert Seta said pt call [employer] and have forms faxed to [phone] and pt request when forms completed to call pt at [phone]. Pt presently on 2-4 liters of oxygen, pt has O2 condenser in home and  02 canister and liquid O2 for portable use.

## 2011-05-07 NOTE — Telephone Encounter (Signed)
Notes faxed over

## 2011-05-21 ENCOUNTER — Ambulatory Visit: Payer: Medicare Other | Admitting: Family Medicine

## 2011-06-17 ENCOUNTER — Ambulatory Visit: Payer: Self-pay | Admitting: Oncology

## 2011-06-17 LAB — CBC CANCER CENTER
Basophil #: 0.1 x10 3/mm (ref 0.0–0.1)
Eosinophil #: 0.1 x10 3/mm (ref 0.0–0.7)
HGB: 12.4 g/dL (ref 12.0–16.0)
Lymphocyte #: 1.1 x10 3/mm (ref 1.0–3.6)
MCH: 30.2 pg (ref 26.0–34.0)
MCHC: 32 g/dL (ref 32.0–36.0)
MCV: 95 fL (ref 80–100)
Monocyte %: 10.2 %
Neutrophil %: 69.4 %
Platelet: 186 x10 3/mm (ref 150–440)
RBC: 4.12 10*6/uL (ref 3.80–5.20)
RDW: 14.6 % — ABNORMAL HIGH (ref 11.5–14.5)
WBC: 6.2 x10 3/mm (ref 3.6–11.0)

## 2011-06-17 LAB — COMPREHENSIVE METABOLIC PANEL
Albumin: 3.5 g/dL (ref 3.4–5.0)
BUN: 15 mg/dL (ref 7–18)
Calcium, Total: 8.6 mg/dL (ref 8.5–10.1)
Co2: 26 mmol/L (ref 21–32)
Creatinine: 1.02 mg/dL (ref 0.60–1.30)
EGFR (Non-African Amer.): 49 — ABNORMAL LOW
Glucose: 135 mg/dL — ABNORMAL HIGH (ref 65–99)
Osmolality: 288 (ref 275–301)
SGOT(AST): 17 U/L (ref 15–37)
SGPT (ALT): 18 U/L

## 2011-06-27 ENCOUNTER — Other Ambulatory Visit: Payer: Self-pay | Admitting: Family Medicine

## 2011-06-28 NOTE — Telephone Encounter (Signed)
Received refill request electronically from pharmacy. Is it okay to refill medication? 

## 2011-06-29 NOTE — Telephone Encounter (Signed)
Spoke with patient, she says she is no longer seeing the neurologist- says she saw them when she was in [place], and she is no longer there.  Advised her Dr. Ermalene Searing will refill her keppra.

## 2011-06-29 NOTE — Telephone Encounter (Signed)
Please call pt .Marland Kitchen Is she still seeing neuro? Pt usually get from that office, I see we have refilled in past, but I would recommend further refills there if still seeing. If not go ahead and fill.

## 2011-07-12 ENCOUNTER — Ambulatory Visit: Payer: Self-pay | Admitting: Oncology

## 2011-07-23 ENCOUNTER — Encounter: Payer: Self-pay | Admitting: Family Medicine

## 2011-07-23 ENCOUNTER — Ambulatory Visit (INDEPENDENT_AMBULATORY_CARE_PROVIDER_SITE_OTHER): Payer: Medicare Other | Admitting: Family Medicine

## 2011-07-23 VITALS — BP 130/70 | HR 57 | Temp 97.4°F | Ht 63.75 in | Wt 116.5 lb

## 2011-07-23 DIAGNOSIS — R5381 Other malaise: Secondary | ICD-10-CM

## 2011-07-23 DIAGNOSIS — C3490 Malignant neoplasm of unspecified part of unspecified bronchus or lung: Secondary | ICD-10-CM

## 2011-07-23 DIAGNOSIS — R5383 Other fatigue: Secondary | ICD-10-CM

## 2011-07-23 DIAGNOSIS — R634 Abnormal weight loss: Secondary | ICD-10-CM

## 2011-07-23 MED ORDER — MIRTAZAPINE 15 MG PO TBDP: 15.0000 mg | ORAL_TABLET | Freq: Every day | ORAL | Status: DC

## 2011-07-23 NOTE — Progress Notes (Signed)
Subjective:    Patient ID: Kristin Cantrell, female    DOB: September 29, 1921, 78 y.o.   MRN: [id number]  HPI  78 year old female with lung cancer, CAD, past history of CVA in 05/2010 (after bx lung mass and mac deg injection, left side body residual weakness), HTN HERE FOR 3 MONTH FOLLOW UP.   RECENT PAST HISTORY: She was admitted to [facility] on Oct 16, 2010 for fall.Marland Kitchen Speech garbled suddenly, right side of body weak, confusion. Called EMS.  Given TPA for presumed stroke... But weakness resolved immediately next day, but off and on speech issues and confusion.. CT scan x 3 was stable. EEG; showed ? 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 Placed on keppra.  No changes with seizure med at this time per NEURO. Fatigue had improved with O2.  Was on oxygen in hospital.  Sent home on 10/2010 with [facility].. OT, PT< RN..Occupational Therapy noted oxygen was 88%... Confusion improved dramatically.    Dx with lung cancer in 05/2010.Marland KitchenMarland KitchenSeeing Dr. Meredeth Ide Pulmonologist.  Completed radiation only...radiation oncologist: Dr Aggie Cosier. Has follow up in 6 months for CT scan of lung.  Not surgical candidate but surgery also likely not necessary.  Last OV was in 10/2010 with Dr. Meredeth Ide... CXR showed no mass at that time.   Sees Dr. Edwin Cap Cardiologist... Sees every 6 months. Saw last in last month..echocardiogram, EKG, chemical stress test... everything was stable.  Saw Dr. Patsy Lager in 04/2011 for viral GE and dehydration. Started appetitie pill... Megace helped but appetitie returned and she stopped it.  Cannot bathes self.... Has aid.Marland Kitchen [facility] that they are paying out of pocket. (family and pt instructed this is not covered by insurance)  Daughter doing pill cases. No skilled nursing needed  Oxygen is not as portable as she needs.. Can this be smaller.  Not currently interested  in hospice.    TODAY: Pt and daughter report:  She is feeling tired, short of breath. No motivation to do anything. Some depression.   Appetite increased but weight loss slowly continued.  She stopped megace because thought appetitie was better. Wt Readings from Last 3 Encounters:  07/23/11 116 lb 8 oz (52.844 kg)  04/29/11 118 lb 12.8 oz (53.887 kg)  12/10/10 125 lb (56.7 kg)   No longer doing PT because weak  physcial therapy.       Review of Systems  Constitutional: Positive for fatigue. Negative for fever.  HENT: Negative for ear pain.   Eyes: Negative for pain.  Respiratory: Positive for shortness of breath. Negative for chest tightness.   Cardiovascular: Negative for chest pain, palpitations and leg swelling.  Gastrointestinal: Negative for abdominal pain and abdominal distention.  Genitourinary: Negative for dysuria.  Neurological: Positive for dizziness. Negative for syncope.       Objective:   Physical Exam  Constitutional: She is oriented to person, place, and time. She appears well-nourished. No distress.       Thin appearing female in wheel chair in NAD,on oxygen Frail appearing.  HENT:  Head: Normocephalic and atraumatic.  Right Ear: External ear normal.  Left Ear: External ear normal.  Nose: Nose normal.  Mouth/Throat: Oropharynx is clear and moist. No oropharyngeal exudate.  Eyes: Conjunctivae and EOM are normal. Pupils are equal, round, and reactive to light. Right eye exhibits no discharge. Left eye exhibits no discharge.  Neck: Normal range of motion. Neck supple.  Cardiovascular: Normal rate, regular rhythm, normal heart sounds and intact distal pulses.  Exam reveals no gallop and no friction rub.   No murmur heard. Pulmonary/Chest: Effort normal. She has decreased breath sounds in the right upper field. She has no wheezes. She has no rhonchi. She has no rales.  Abdominal: Soft. Normal appearance, normal aorta and bowel sounds are normal. There is no  tenderness. There is no rebound.  Musculoskeletal:       strength 4/5 throughout  Neurological: She is alert and oriented to person, place, and time. She has normal reflexes.  Skin: Skin is warm. No rash noted. She is not diaphoretic. No pallor.  Psychiatric: She has a normal mood and affect. Her speech is normal and behavior is normal. Thought content normal. Cognition and memory are normal.          Assessment & Plan:

## 2011-07-23 NOTE — Assessment & Plan Note (Addendum)
Likely multifactorial.. Due to weight loss, no exercise and deconditioning. No clear med SE but she can try trial off simvastatin.  per pt cardiology work up negative.. Had labs there as well.. Will obtain. ? ifchecked hemoglobin, vit B12, TSH. Etc.  Also possible mild depression causing issue.. Will start remeron as above.

## 2011-07-23 NOTE — Patient Instructions (Addendum)
Consider trial of 1 week off simvastain.. If no improvement restart.  Change to low dose Remeron/mirtazapine at bedtime.  Start protein shakes with meals. Follow up for energy in 1 month.

## 2011-07-23 NOTE — Assessment & Plan Note (Signed)
Stable per ONc/pulm.

## 2011-07-23 NOTE — Assessment & Plan Note (Signed)
Have her start protein shake and remeron for appetite.

## 2011-08-03 ENCOUNTER — Inpatient Hospital Stay: Payer: Self-pay | Admitting: Internal Medicine

## 2011-08-03 ENCOUNTER — Telehealth: Payer: Self-pay | Admitting: Family Medicine

## 2011-08-03 LAB — CBC
HCT: 36.7 % (ref 35.0–47.0)
HGB: 12 g/dL (ref 12.0–16.0)
MCH: 30.9 pg (ref 26.0–34.0)
MCV: 94 fL (ref 80–100)
Platelet: 153 10*3/uL (ref 150–440)
RBC: 3.9 10*6/uL (ref 3.80–5.20)
WBC: 9.4 10*3/uL (ref 3.6–11.0)

## 2011-08-03 LAB — BASIC METABOLIC PANEL
BUN: 16 mg/dL (ref 7–18)
Chloride: 106 mmol/L (ref 98–107)
Creatinine: 0.95 mg/dL (ref 0.60–1.30)
EGFR (Non-African Amer.): 54 — ABNORMAL LOW

## 2011-08-03 LAB — URINALYSIS, COMPLETE
Bacteria: NONE SEEN
Glucose,UR: NEGATIVE mg/dL (ref 0–75)
Nitrite: NEGATIVE
Protein: NEGATIVE
RBC,UR: 1 /HPF (ref 0–5)
Specific Gravity: 1.017 (ref 1.003–1.030)
WBC UR: 1 /HPF (ref 0–5)

## 2011-08-03 NOTE — Telephone Encounter (Signed)
Emergent call Caller: Ellen/Child; PCP: Ermalene Searing, Amy E.; CB#: [phone]; Call regarding Weakness, follows fall 08/02/11, with prior fall 07/27/11 chest was sore, Fever of 99.1 (oral) states "I don't feel well" Per CSR "the PATIENT REFUSED 911" now after 30 minutes, T 100, slurred speech, disoriented for 2-3 days. Oxygen level 74% on room air. Symptoms reviewed Weakness Guideline, with Pain under right breast area, with change in mental status, advised to call 911.

## 2011-08-03 NOTE — Telephone Encounter (Signed)
Pr with complicated medical history: lung cancer, past CVA etc. Recommend going to ER immediately given fever, confusion, hypoxia and slurred speech.

## 2011-08-03 NOTE — Telephone Encounter (Signed)
Spoke with Maxie Barb notified as instructed by telephone. and she agrees pt needs to be seen immediately and she will call 911 now. Alvino Chapel wants pt to go to [facility] ER; pt has upcoming appt with neurologist there; but if 911 [place] verified stabalized will go to [facility] ER. I offered to call 911 for pt but Alvino Chapel said she would do now.

## 2011-08-04 LAB — BASIC METABOLIC PANEL
BUN: 16 mg/dL (ref 7–18)
Co2: 28 mmol/L (ref 21–32)
Creatinine: 1 mg/dL (ref 0.60–1.30)
EGFR (African American): 59 — ABNORMAL LOW
EGFR (Non-African Amer.): 51 — ABNORMAL LOW
Glucose: 146 mg/dL — ABNORMAL HIGH (ref 65–99)
Sodium: 142 mmol/L (ref 136–145)

## 2011-08-04 LAB — CBC WITH DIFFERENTIAL/PLATELET
Basophil %: 0.2 %
Eosinophil #: 0 10*3/uL (ref 0.0–0.7)
Eosinophil %: 0 %
HCT: 38 % (ref 35.0–47.0)
HGB: 12.4 g/dL (ref 12.0–16.0)
Lymphocyte #: 0.6 10*3/uL — ABNORMAL LOW (ref 1.0–3.6)
MCH: 30.8 pg (ref 26.0–34.0)
MCHC: 32.7 g/dL (ref 32.0–36.0)
MCV: 94 fL (ref 80–100)
Monocyte #: 0.2 x10 3/mm (ref 0.2–0.9)
Monocyte %: 2 %
Platelet: 144 10*3/uL — ABNORMAL LOW (ref 150–440)
RBC: 4.04 10*6/uL (ref 3.80–5.20)

## 2011-08-04 LAB — TROPONIN I: Troponin-I: <0.02 ng/mL

## 2011-08-04 LAB — LIPID PANEL
Cholesterol: 171 mg/dL (ref 0–200)
HDL Cholesterol: 43 mg/dL (ref 40–60)
Ldl Cholesterol, Calc: 113 mg/dL — ABNORMAL HIGH (ref 0–100)
Triglycerides: 77 mg/dL (ref 0–200)

## 2011-08-09 LAB — CULTURE, BLOOD (SINGLE)

## 2011-08-17 NOTE — Telephone Encounter (Signed)
Reviewed. Agreed.

## 2011-08-17 NOTE — Telephone Encounter (Signed)
Dr Ermalene Searing, please review notes below.

## 2011-08-19 ENCOUNTER — Telehealth: Payer: Self-pay

## 2011-08-19 NOTE — Telephone Encounter (Signed)
Alvino Chapel notified as instructed by phone.

## 2011-08-19 NOTE — Telephone Encounter (Signed)
Agreed -

## 2011-08-19 NOTE — Telephone Encounter (Signed)
Pt in nursing home now finished Levaquin 5 days ago; did chest xray 08/18/11 diagnosed pneumonitis; started on Avelox. Pt said can hear crackling sound in lungs at night. Alvino Chapel wanted to know if chest xray would show pneumonia. Alvino Chapel is satisfied with pts care at this time. Wanted to know if Dr Ermalene Searing would recommend anything else.

## 2011-08-19 NOTE — Telephone Encounter (Signed)
This is a confusing message. Presumably her nursing home has medical staff that have assessed her.   I completely agree that avelox is appropriate for pneumonia. I would continue with this and with the recommendations of the medical staff at her facility.

## 2011-08-24 ENCOUNTER — Ambulatory Visit: Payer: Medicare Other | Admitting: Family Medicine

## 2011-09-24 ENCOUNTER — Telehealth: Payer: Self-pay | Admitting: Family Medicine

## 2011-09-24 NOTE — Telephone Encounter (Signed)
Okay to give verbal.. Let me know if written prescription needed.

## 2011-09-24 NOTE — Telephone Encounter (Signed)
[name] with [facility] calling to get an order for patient to get physical therapy twice a week for 6 weeks.  Please call [name] back.  Thanks

## 2011-09-27 NOTE — Telephone Encounter (Signed)
[name] notified as instructed by telephone. Verbal order given.

## 2011-10-01 DIAGNOSIS — R32 Unspecified urinary incontinence: Secondary | ICD-10-CM

## 2011-10-01 DIAGNOSIS — I4891 Unspecified atrial fibrillation: Secondary | ICD-10-CM

## 2011-10-01 DIAGNOSIS — R262 Difficulty in walking, not elsewhere classified: Secondary | ICD-10-CM

## 2011-10-01 DIAGNOSIS — J441 Chronic obstructive pulmonary disease with (acute) exacerbation: Secondary | ICD-10-CM

## 2011-10-05 ENCOUNTER — Telehealth: Payer: Self-pay | Admitting: Family Medicine

## 2011-10-05 NOTE — Telephone Encounter (Signed)
Noted  

## 2011-10-05 NOTE — Telephone Encounter (Signed)
[name] from [facility] calling, message to Dr Ermalene Searing, missed visit 10/05/11 Kristin Cantrell 06/03/1921, due to patient being out of town.

## 2011-10-12 ENCOUNTER — Encounter: Payer: Self-pay | Admitting: Family Medicine

## 2011-10-12 ENCOUNTER — Ambulatory Visit (INDEPENDENT_AMBULATORY_CARE_PROVIDER_SITE_OTHER): Payer: Medicare Other | Admitting: Family Medicine

## 2011-10-12 ENCOUNTER — Telehealth: Payer: Self-pay | Admitting: *Deleted

## 2011-10-12 VITALS — BP 110/64 | HR 68 | Temp 97.6°F | Resp 24 | Ht 63.75 in | Wt 119.2 lb

## 2011-10-12 DIAGNOSIS — Z23 Encounter for immunization: Secondary | ICD-10-CM

## 2011-10-12 DIAGNOSIS — J309 Allergic rhinitis, unspecified: Secondary | ICD-10-CM

## 2011-10-12 DIAGNOSIS — N390 Urinary tract infection, site not specified: Secondary | ICD-10-CM

## 2011-10-12 DIAGNOSIS — J302 Other seasonal allergic rhinitis: Secondary | ICD-10-CM

## 2011-10-12 DIAGNOSIS — R634 Abnormal weight loss: Secondary | ICD-10-CM

## 2011-10-12 DIAGNOSIS — R5383 Other fatigue: Secondary | ICD-10-CM

## 2011-10-12 DIAGNOSIS — R5381 Other malaise: Secondary | ICD-10-CM

## 2011-10-12 NOTE — Progress Notes (Signed)
Subjective:    Patient ID: Kristin Cantrell, female    DOB: [birth year], 78 y.o.   MRN: [id number]  HPI  78 year old female with lung cancer, CAD, past history of CVA in 05/2010 (after bx lung mass and mac deg injection, left side body residual weakness), HTN HERE FOR RECENT HOSPITAL FOLLOW UP. Hospital discharge followed by rehab stay.    PAST HISTORY:  She was admitted to [facility] on Oct 16, 2010 for fall.Marland Kitchen Speech garbled suddenly, right side of body weak, confusion. Called EMS.  Given TPA for presumed stroke... But weakness resolved immediately next day, but off and on speech issues and confusion.. CT scan x 3 was stable. EEG; showed ? 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 Placed on keppra.  No changes with seizure med at this time per NEURO. Fatigue had improved with O2.  Was on oxygen in hospital.  Sent home on 10/2010 with [facility].. OT, PT< RN..Occupational Therapy noted oxygen was 88%... Confusion improved dramatically.  Dx with lung cancer in 05/2010.Marland KitchenMarland KitchenSeeing Dr. Meredeth Ide Pulmonologist.  Completed radiation only...radiation oncologist: Dr Aggie Cosier. Has follow up in 6 months for CT scan of lung.  Not surgical candidate but surgery also likely not necessary.   Admitted to [facility]  for pneumonia and weakness following a fall. Was there for three days.  She then stayed at [facility] for 1 month.  Has been home for 2 weeks.  She has been eating better. Now three meals a day. She is feeling stronger overall.  No longer taking mirtazapine. Wt Readings from Last 3 Encounters:  10/12/11 119 lb 4 oz (54.091 kg)  07/23/11 116 lb 8 oz (52.844 kg)  04/29/11 118 lb 12.8 oz (53.887 kg)   PT, Home [facility] for 6 weeks.  Has been having some wax and pain, decreased hearing in right ear.  Few months ago had ear wax removal.  Some allergy symptoms.Marland Kitchen tickly sore throat.    Has started back on flonase 2 spray per nostril.   When she was catheterized in hospital...had trouble placing catheter. At home she has noted dribbling, some urinary frequency and retention for a long time.  No dysuria. She is on Bactrim for UTI recurrence. Dr. Garner Nash is urologist.     Review of Systems  Constitutional: Positive for fatigue. Negative for fever.  HENT: Negative for ear pain.   Eyes: Negative for pain.  Respiratory: Positive for shortness of breath. Negative for chest tightness.   Cardiovascular: Negative for chest pain, palpitations and leg swelling.  Gastrointestinal: Negative for abdominal pain and abdominal distention.  Genitourinary: Negative for dysuria.  Neurological: Positive for dizziness. Negative for syncope.       Objective:   Physical Exam  Constitutional: She is oriented to person, place, and time. She appears well-nourished. No distress.       Thin appearing female in wheel chair in NAD,on oxygen Frail appearing.  HENT:  Head: Normocephalic and atraumatic.  Right Ear: External ear normal.  Left Ear: External ear normal.  Nose: Nose normal.  Mouth/Throat: Oropharynx is clear and moist. No oropharyngeal exudate.  Eyes: Conjunctivae normal and EOM are normal. Pupils are equal, round, and reactive to light. Right eye exhibits no discharge. Left eye exhibits no discharge.  Neck: Normal range of motion. Neck supple.  Cardiovascular: Normal rate, regular rhythm, normal heart sounds and intact distal  pulses.  Exam reveals no gallop and no friction rub.   No murmur heard. Pulmonary/Chest: Effort normal. She has decreased breath sounds in the right upper field. She has no wheezes. She has no rhonchi. She has no rales.  Abdominal: Soft. Normal appearance, normal aorta and bowel sounds are normal. There is no tenderness. There is no rebound.  Musculoskeletal:       strength 4/5 throughout  Neurological: She is alert and oriented to person, place, and  time. She has normal reflexes.  Skin: Skin is warm. No rash noted. She is not diaphoretic. No pallor.  Psychiatric: She has a normal mood and affect. Her speech is normal and behavior is normal. Thought content normal. Cognition and memory are normal.          Assessment & Plan:

## 2011-10-12 NOTE — Patient Instructions (Addendum)
Make an appt with Dr. Garner Nash Urologist for possible uretheral dilation. Start back on flonase 2 sprays in nostril daily.

## 2011-10-12 NOTE — Telephone Encounter (Signed)
Received a form from [employer] to complete, sign and faxed back for equipment (liquid 02 contents). Letter and form is in your in box.

## 2011-10-15 DIAGNOSIS — J302 Other seasonal allergic rhinitis: Secondary | ICD-10-CM | POA: Insufficient documentation

## 2011-10-15 NOTE — Assessment & Plan Note (Signed)
Return to [facility] for urinary flow issues.

## 2011-10-15 NOTE — Assessment & Plan Note (Signed)
Improved with regular eating.

## 2011-10-15 NOTE — Assessment & Plan Note (Signed)
Start back on flonase 2 sprays in nostril daily.

## 2011-10-15 NOTE — Assessment & Plan Note (Signed)
Now gaining weight. Stopped remeron.

## 2011-11-03 ENCOUNTER — Telehealth: Payer: Self-pay | Admitting: Family Medicine

## 2011-11-03 NOTE — Telephone Encounter (Signed)
Caller: [name]/Other; Patient Name: Kristin Cantrell; PCP: Kerby Nora [facility]); Best Callback Phone Number: [phone]; Call regarding: Physical Therapy; Brynnlie has been doing PT for 4 wks with [facility] to help with strength, balance and endurance; [name] is a physical therapist and would like orders to extend the PT

## 2011-11-04 NOTE — Telephone Encounter (Signed)
Okay to extend PT 

## 2011-11-04 NOTE — Telephone Encounter (Signed)
Advise [name]/Physical Therapist that Dr. Ermalene Searing did Okay extending the PT

## 2011-11-20 ENCOUNTER — Other Ambulatory Visit: Payer: Self-pay | Admitting: Family Medicine

## 2012-01-14 ENCOUNTER — Ambulatory Visit: Payer: Medicare Other | Admitting: Family Medicine

## 2012-02-09 ENCOUNTER — Other Ambulatory Visit: Payer: Self-pay | Admitting: Family Medicine

## 2012-02-13 ENCOUNTER — Other Ambulatory Visit: Payer: Self-pay | Admitting: Family Medicine

## 2012-02-23 ENCOUNTER — Telehealth: Payer: Self-pay

## 2012-02-23 NOTE — Telephone Encounter (Signed)
tamiflu 75 mg, 1 po bid, #10

## 2012-02-23 NOTE — Telephone Encounter (Signed)
Alvino Chapel, pt's daugther request prescription for Tamiflu; pt has lung CA and is on 3L of Oygen; question if exposed to flu due to people visiting. Today pt has started temp 99(normal temp is 97).Pt has a cough but Alvino Chapel is not sure if pt's cough is productive or not, pt feels achy with h/a and weakness. Pt cannot come out and Alvino Chapel request Tamiflu sent to [facility] [place].Please advise.Alvino Chapel request call back.

## 2012-02-23 NOTE — Telephone Encounter (Signed)
Advised patient's daughter that tamiflu will be sent to pharmacy.  I checked with [facility] in [place] and they do have it in stock.

## 2012-03-20 ENCOUNTER — Other Ambulatory Visit: Payer: Self-pay | Admitting: *Deleted

## 2012-03-20 MED ORDER — SIMVASTATIN 20 MG PO TABS: 20.0000 mg | ORAL_TABLET | Freq: Every day | ORAL | Status: AC

## 2012-04-04 ENCOUNTER — Ambulatory Visit: Payer: Self-pay | Admitting: Ophthalmology

## 2012-04-17 ENCOUNTER — Encounter: Payer: Self-pay | Admitting: Family Medicine

## 2012-04-18 ENCOUNTER — Ambulatory Visit: Payer: Self-pay | Admitting: Ophthalmology

## 2012-05-04 ENCOUNTER — Encounter: Payer: Self-pay | Admitting: Family Medicine

## 2012-05-04 ENCOUNTER — Ambulatory Visit (INDEPENDENT_AMBULATORY_CARE_PROVIDER_SITE_OTHER): Payer: Medicare Other | Admitting: Family Medicine

## 2012-05-04 VITALS — BP 110/70 | HR 96 | Temp 97.4°F | Ht 63.75 in | Wt 114.0 lb

## 2012-05-04 DIAGNOSIS — I10 Essential (primary) hypertension: Secondary | ICD-10-CM

## 2012-05-04 DIAGNOSIS — R634 Abnormal weight loss: Secondary | ICD-10-CM

## 2012-05-04 DIAGNOSIS — R5383 Other fatigue: Secondary | ICD-10-CM

## 2012-05-04 DIAGNOSIS — R5381 Other malaise: Secondary | ICD-10-CM

## 2012-05-04 MED ORDER — MIRTAZAPINE 15 MG PO TBDP: 15.0000 mg | ORAL_TABLET | Freq: Every day | ORAL | Status: DC

## 2012-05-04 NOTE — Patient Instructions (Addendum)
Restart mirtazapine/remeron at 15 mg daily. Return to GYN for re-evaluation of vaginal bleeding on estradiol and urinary issues. Follow up in 1 month for weight check.

## 2012-05-04 NOTE — Progress Notes (Signed)
79 year old female with lung cancer, CAD, past history of CVA in 05/2010 (after bx lung mass and mac deg injection, left side body residual weakness), HTN HERE FOR 3 MONTH FOLLOW UP.   RECENT PAST HISTORY:  She was admitted to [facility] on Oct 16, 2010 for fall.Marland Kitchen Speech garbled suddenly, right side of body weak, confusion. Called EMS.  Given TPA for presumed stroke... But weakness resolved immediately next day, but off and on speech issues and confusion.. CT scan x 3 was stable. EEG; showed ? 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 Placed on keppra.  No changes with seizure med at this time per NEURO. Fatigue had improved with O2.  Was on oxygen in hospital.  Sent home on 10/2010 with [facility].. OT, PT< RN..Occupational Therapy noted oxygen was 88%... Confusion improved dramatically.  Dx with lung cancer in 05/2010.Marland KitchenMarland KitchenSeeing Dr. Meredeth Ide Pulmonologist.  Completed radiation only...radiation oncologist: Dr Aggie Cosier. Has follow up in 6 months for CT scan of lung.  Not surgical candidate but surgery also likely not necessary.  Last OV was in 10/2010 with Dr. Meredeth Ide... CXR showed no mass at that time.  Sees Dr. Edwin Cap Cardiologist... Sees every 6 months. Saw last in last month..echocardiogram, EKG, chemical stress test... everything was stable.  Saw Dr. Patsy Lager in 04/2011 for viral GE and dehydration.  Started appetitie pill... Megace helped but appetitie returned and she stopped it.   Cannot bathes self.... Has aid.Marland Kitchen [facility] [place] [facility] that they are paying out of pocket. (family and pt instructed this is not covered by insurance)  Daughter doing pill cases. No skilled nursing needed  Oxygen is not as portable as she needs.. Can this be smaller.  Not currently interested in hospice.  No longer doing PT because weak physcial therapy.   Admitted to [facility] for pneumonia and weakness  following a fall 08/2011 Was there for three days.  She then stayed at [facility] for 1 month in 08/2011  Last  appt 1 week ago with Dr. Edwin Cap... Cards: stable. Appt with Dr. Meredeth Ide next week.   TODAY:  She continues to be fatigued, poor appetite.. She is not sure why she stopped remeron... Will restart.  Wt Readings from Last 3 Encounters:  05/04/12 114 lb (51.71 kg)  10/12/11 119 lb 4 oz (54.091 kg)  07/23/11 116 lb 8 oz (52.844 kg)   1 week ago had weakness in left leg, wersening. Felt like it would give out more than normal. Lasted a few hours.  At same time she had fatigue, presyncope. Few days prior pain in back running to left buttock.. Last few hours.  All those symptoms are better now.  Seing URO for difficulty urinating.. Told there was vaginal skin over urethral opening... Referred to GYN. Started on estradiol, but having vag bleeding now.      Review of Systems  Constitutional: Positive for fatigue. Negative for fever.  HENT: Negative for ear pain.  Eyes: Negative for pain.  Respiratory: Positive for shortness of breath. Negative for chest tightness.  Cardiovascular: Negative for chest pain, palpitations and leg swelling.  Gastrointestinal: Negative for abdominal pain and abdominal distention.  Genitourinary: Negative for dysuria.  Neurological: Positive for dizziness. Negative for syncope.    Objective:   Physical Exam  Constitutional: She is oriented to person, place, and time. She appears well-nourished. No distress.  Thin appearing female in wheel chair in NAD,on oxygen Frail appearing.  HENT:  Head: Normocephalic and atraumatic.  Right Ear: External ear normal.  Left Ear: External ear normal.  Nose: Nose normal.  Mouth/Throat: Oropharynx is clear and moist. No oropharyngeal exudate.  Eyes: Conjunctivae and EOM are normal. Pupils are equal, round, and reactive to light. Right eye exhibits no discharge. Left eye exhibits no discharge.  Neck: Normal range  of motion. Neck supple.  Cardiovascular: Normal rate, regular rhythm, normal heart sounds and intact distal pulses. Exam reveals no gallop and no friction rub.  No murmur heard.  Pulmonary/Chest: Effort normal. She has decreased breath sounds in the right upper field. She has no wheezes. She has no rhonchi. She has no rales.  Abdominal: Soft. Normal appearance, normal aorta and bowel sounds are normal. There is no tenderness. There is no rebound.  Musculoskeletal:  strength 4/5 throughout  Neurological: She is alert and oriented to person, place, and time. She has normal reflexes.  Skin: Skin is warm. No rash noted. She is not diaphoretic. No pallor.  Psychiatric: She has a normal mood and affect. Her speech is normal and behavior is normal. Thought content normal. Cognition and memory are normal.

## 2012-05-08 ENCOUNTER — Telehealth: Payer: Self-pay | Admitting: Family Medicine

## 2012-05-08 NOTE — Telephone Encounter (Signed)
Patient Information:  Caller Name: Britta Mccreedy  Phone: [phone]  Patient: Kristin Cantrell, Kristin Cantrell  Gender: Female  DOB: 1921-07-02  Age: 79 Years  PCP: Kerby Nora (Family Practice)  Office Follow Up:  Does the office need to follow up with this patient?: Yes  Instructions For The Office: OFFICE PLEASE LET DR Ermalene Searing AWARE OF PT FEELING DIZZY FROM THE MEDICATION.  AND PLEASE REFILL MEGACE FOR PT.  PHARMACY IS [facility] [phone]  RN Note:  Caller wants Dr Ermalene Searing to know and put on her chart that Remeron is making pt sick.  Caller also needs a refill of the Megace.  Symptoms  Reason For Call & Symptoms: difficulty getting out of the bed and slightly slurred speech.  Caller thinks it is because of the medication "remeron".  All symptoms are resolved now, pt is still feeling funny.  Caller states she was on Remeron before and it gave her the same symptoms before.  Reviewed Health History In EMR: Yes  Reviewed Medications In EMR: Yes  Reviewed Allergies In EMR: Yes  Reviewed Surgeries / Procedures: Yes  Date of Onset of Symptoms: 05/08/2012  Guideline(s) Used:  Dizziness  Disposition Per Guideline:   Discuss with PCP and Callback by Nurse Today  Reason For Disposition Reached:   Taking a medicine that could cause dizziness (e.g., blood pressure medications, diuretics)  Advice Given:  N/A  Patient Will Follow Care Advice:  YES

## 2012-05-09 MED ORDER — MEGESTROL ACETATE 40 MG PO TABS: 400.0000 mg | ORAL_TABLET | Freq: Every day | ORAL | Status: AC

## 2012-05-09 NOTE — Telephone Encounter (Signed)
Stop remeron.  Start megace

## 2012-05-13 NOTE — Assessment & Plan Note (Signed)
Restart Remeron. Encourage meal supplements.

## 2012-05-13 NOTE — Assessment & Plan Note (Signed)
Well controlled. Continue current medication.  

## 2012-05-13 NOTE — Assessment & Plan Note (Signed)
Likely due to malnutrition as well as chronic healthy issues.

## 2012-05-20 ENCOUNTER — Other Ambulatory Visit: Payer: Self-pay | Admitting: Family Medicine

## 2012-06-06 ENCOUNTER — Ambulatory Visit: Payer: Medicare Other | Admitting: Family Medicine

## 2012-06-06 DIAGNOSIS — Z0289 Encounter for other administrative examinations: Secondary | ICD-10-CM

## 2012-06-08 ENCOUNTER — Telehealth: Payer: Self-pay

## 2012-06-08 ENCOUNTER — Other Ambulatory Visit: Payer: Self-pay | Admitting: *Deleted

## 2012-06-08 NOTE — Telephone Encounter (Signed)
Sharyne Peach NP at [facility] at [facility] left v/m requesting call back to discuss pt's medications. Pt is presently in hospital for dx with stroke and seizures. Pt will probably be discharged to skilled nursing unit.

## 2012-06-08 NOTE — Telephone Encounter (Signed)
Spoke with nurse practitioner and she wanted medication list faxed to her so I faxed over medication list

## 2012-06-12 ENCOUNTER — Telehealth: Payer: Self-pay | Admitting: Family Medicine

## 2012-06-12 ENCOUNTER — Non-Acute Institutional Stay (SKILLED_NURSING_FACILITY): Payer: Medicare Other | Admitting: Internal Medicine

## 2012-06-12 DIAGNOSIS — N952 Postmenopausal atrophic vaginitis: Secondary | ICD-10-CM | POA: Insufficient documentation

## 2012-06-12 DIAGNOSIS — J449 Chronic obstructive pulmonary disease, unspecified: Secondary | ICD-10-CM | POA: Insufficient documentation

## 2012-06-12 DIAGNOSIS — R5381 Other malaise: Secondary | ICD-10-CM

## 2012-06-12 DIAGNOSIS — I639 Cerebral infarction, unspecified: Secondary | ICD-10-CM

## 2012-06-12 DIAGNOSIS — E039 Hypothyroidism, unspecified: Secondary | ICD-10-CM

## 2012-06-12 DIAGNOSIS — I6350 Cerebral infarction due to unspecified occlusion or stenosis of unspecified cerebral artery: Secondary | ICD-10-CM

## 2012-06-12 DIAGNOSIS — N390 Urinary tract infection, site not specified: Secondary | ICD-10-CM

## 2012-06-12 DIAGNOSIS — I4891 Unspecified atrial fibrillation: Secondary | ICD-10-CM

## 2012-06-12 DIAGNOSIS — K649 Unspecified hemorrhoids: Secondary | ICD-10-CM | POA: Insufficient documentation

## 2012-06-12 DIAGNOSIS — G40909 Epilepsy, unspecified, not intractable, without status epilepticus: Secondary | ICD-10-CM

## 2012-06-12 NOTE — Assessment & Plan Note (Signed)
Continue premarin cream for now and follow with gyn as outpatient

## 2012-06-12 NOTE — Telephone Encounter (Signed)
Called daughter back to advised not sure why someone called her but got voicemail will call back later

## 2012-06-12 NOTE — Assessment & Plan Note (Signed)
Tolerating keppra at increased dose.

## 2012-06-12 NOTE — Assessment & Plan Note (Signed)
Rate controlled, continue asa for anticoagulation ad atenolol for rate control

## 2012-06-12 NOTE — Telephone Encounter (Signed)
Daughter advised unknown reason for call but I will call her if I find out

## 2012-06-12 NOTE — Telephone Encounter (Signed)
Daughter left voicemail saying she is returning call from our office

## 2012-06-12 NOTE — Assessment & Plan Note (Signed)
Continue bactrim for secondary prophyaxis, currently asymptomatic

## 2012-06-12 NOTE — Assessment & Plan Note (Signed)
Continue current regimen of levothyroxine

## 2012-06-12 NOTE — Progress Notes (Signed)
Patient ID: Kristin Cantrell, female   DOB: 1921/12/07, 79 y.o.   MRN: [id number]    PCP: Kerby Nora, MD  Code Status: DNR  Allergies  Allergen Reactions  . Codeine Nausea Only    Chief Complaint: new admit post hospitalization 06/04/12- 06/09/12  HPI:  79 y/o elderly female patient is here for STR post hospital admission for acute onset right arm weakness and slurred speech. She has history of stroke, seizure, afib not on anticoagulation. Neurology was consulted this admission. It was thought to be from seizure and keppra dose was increased to 1000 mg bid.she was seen by PT/OT and speech therapy team. She also had uti and ARF. Her chronic medical issues were addressed as well. With her weakness, she was sent to SNF Patient was seen in room today with her daughter present. She still has problem articulating. She is aao x3. She has been straining with defecation and has fresh blood in her stool. Her daughter wants to take her home on Friday. She is wheelchair bound at present with her weakness. She has hx of severe atrophic vaginitis. As per the daughter, since admission to hospital, her articulation has been imporving and she is clearing out mentally as well. She still has problem articulating but not as few days back  Review of Systems  Constitutional: Positive for malaise/fatigue. Negative for fever, chills and diaphoresis.  HENT: Negative for congestion.   Eyes: Negative for blurred vision.  Respiratory: Negative for cough and shortness of breath.   Cardiovascular: Negative for chest pain, palpitations, orthopnea and leg swelling.  Gastrointestinal: Positive for constipation and blood in stool. Negative for heartburn, nausea, vomiting and abdominal pain.  Genitourinary: Negative for dysuria, urgency and frequency.  Musculoskeletal: Negative for myalgias and falls.  Skin: Negative for rash.  Neurological: Positive for weakness. Negative for dizziness, seizures and headaches.   Psychiatric/Behavioral: Negative for depression. The patient does not have insomnia.     No past medical history on file. Past Surgical History  Procedure Laterality Date  . Hip pinning  2007     left hip after fall  . Pacemaker insertion  2007  . Tonsillectomy     Social History:   reports that she has quit smoking. She has never used smokeless tobacco. She reports that she does not drink alcohol or use illicit drugs.  Family History  Problem Relation Age of Onset  . Stroke Mother   . Cancer Mother     breast and colon cancer  . Heart disease Father   . Hypertension Father   . Hyperlipidemia Father   . Cancer Sister     ovarian and uterine cancer  . Heart disease Brother 47    died  . Cancer Brother     prostate cancer    Medications: Patient's Medications  New Prescriptions   No medications on file  Previous Medications   ASPIRIN 81 MG TABLET    Take 81 mg by mouth 2 (two) times daily.    ATENOLOL (TENORMIN) 25 MG TABLET    1/2 tab by mouth in AM and 1 tab by mouth in PM after meal.   CYCLOSPORINE (RESTASIS) 0.05 % OPHTHALMIC EMULSION    Place 1 drop into both eyes 2 (two) times daily.     ESTROGENS, CONJUGATED, (PREMARIN) 0.3 MG TABLET    Take 0.3 mg by mouth daily. Take daily for 21 days then do not take for 7 days.   FLUTICASONE (FLONASE) 50 MCG/ACT NASAL SPRAY  Place 1 spray into the nose daily.   IPRATROPIUM (ATROVENT) 0.06 % NASAL SPRAY    As instructed   ISOSORBIDE MONONITRATE (IMDUR) 30 MG 24 HR TABLET    Take 30 mg by mouth daily.    LEVETIRACETAM (KEPPRA) 1000 MG TABLET    Take 1,000 mg by mouth 2 (two) times daily.   LEVOTHYROXINE (SYNTHROID, LEVOTHROID) 25 MCG TABLET    take 1 tablet by mouth once daily   SIMVASTATIN (ZOCOR) 20 MG TABLET    Take 1 tablet (20 mg total) by mouth at bedtime.   SULFAMETHOXAZOLE-TRIMETHOPRIM (BACTRIM,SEPTRA) 400-80 MG PER TABLET    take 1 tablet by mouth once daily   TIOTROPIUM (SPIRIVA) 18 MCG INHALATION CAPSULE    Place 18  mcg into inhaler and inhale daily.  Modified Medications   No medications on file  Discontinued Medications   LEVETIRACETAM (KEPPRA) 750 MG TABLET    take 1 tablet by mouth every 12 hours   LEVETIRACETAM (KEPPRA) 750 MG TABLET    take 1 tablet by mouth every 12 hours    Physical Exam:  Filed Vitals:   06/12/12 1741  BP: 113/64  Pulse: 65  Temp: 98.4 F (36.9 C)  Resp: 18  SpO2: 99%   gen- elderly, frail, in NAD heent- no pallor, no icterus, no LAD, MMM CVS- ns1,s2, rrr respi- b/l cta, on o2 by nasal canula abdo- bs+, non tender, non distended Ext- able to move all 4, left sided LE weakness > right Neuro- dysarthria, has eye muscle twitching (chronic), aaox 3, mood and behavior appropriate  Labs and imaging reviewed, see discharge summary for full review of reports   Assessment/Plan  Seizure- no recent seizure activity reported, tolerating keppra well. Monitor clinically  External hemorrhoids- will add anusol cream, to increase fiber intake and water as tolerated. To avoid straining with defecation where possible  Generalized weakness- from deconditioning, to work with PT/OT, fall precautions, encourage to be OOB. Encourage po intake with aspiration precautions. Daughter wants to take patient home this Friday. Will see how patient works with therapy team and what type of progress is made. Will need home health services set up prior to discharge  cva late effect- continue asa, bp under control. Continue current b meds regimen and statin  htn- continue atenolol and imdur  Vaginal atrophy- on premarin cream, to follow with her ob/gyn  Copd- on o2 by nasal canula and on bronchodilator treatment and breathing under control  Recurrent uti- continue bactrim ds, maintain hydration.  Hypothyroidism- continue levothyroxine, monitor clinically6   Family/ staff Communication: discussed care plan in detail with patient, her daughter and nursing supervisor and answered questions  from daughter   Labs/tests ordered  Cbc, bmp

## 2012-06-12 NOTE — Assessment & Plan Note (Signed)
Remains chest pain free. Continue b blocker, statin, imdur. bp well controlled.

## 2012-06-12 NOTE — Assessment & Plan Note (Signed)
On chronic o2 by nasal canula , cotninue this with bronchodilators and monitor clinically

## 2012-06-13 NOTE — Telephone Encounter (Signed)
I am not aware of a reason for the call.

## 2012-06-15 ENCOUNTER — Encounter: Payer: Self-pay | Admitting: Nurse Practitioner

## 2012-06-16 MED ORDER — LEVETIRACETAM 1000 MG PO TABS: 1000.0000 mg | ORAL_TABLET | Freq: Two times a day (BID) | ORAL | Status: DC

## 2012-06-19 DIAGNOSIS — J449 Chronic obstructive pulmonary disease, unspecified: Secondary | ICD-10-CM

## 2012-06-19 DIAGNOSIS — R569 Unspecified convulsions: Secondary | ICD-10-CM

## 2012-06-19 DIAGNOSIS — R4789 Other speech disturbances: Secondary | ICD-10-CM

## 2012-06-19 DIAGNOSIS — M6281 Muscle weakness (generalized): Secondary | ICD-10-CM

## 2012-06-27 ENCOUNTER — Telehealth: Payer: Self-pay

## 2012-06-27 NOTE — Telephone Encounter (Signed)
Agreed, please provide verbal order as requested.

## 2012-06-27 NOTE — Telephone Encounter (Signed)
Amy OT with [facility] left v/m requesting verbal order for OT 2 x a week for 6 weeks.Please advise.

## 2012-06-27 NOTE — Telephone Encounter (Signed)
Verbal ok given to Amy with [facility].

## 2012-06-30 ENCOUNTER — Ambulatory Visit: Payer: Medicare Other | Admitting: Family Medicine

## 2012-06-30 ENCOUNTER — Telehealth: Payer: Self-pay

## 2012-06-30 NOTE — Telephone Encounter (Signed)
Alvino Chapel, pts daughter said for 6 months pt has had vaginal spotting; has seen Dr Grayling Congress and was scheduled for [facility] 05/31/12 but family cancelled for fear pt could not make it thru the surgery. approx 06/04/12 pt started with more bleeding ? Coming from rectum. Dark blood with 2 large clots this AM. Thinks pt had hemorrhoid external said [facility] gave hemorrhoid cream that helped external hemorrhoid Alvino Chapel does not think hemorrhoid there now). Pt has no pain,no constipation or diarrhea now. Pt had appt with Dr Ermalene Searing today at 3:30 but pt was too tired to see Dr Ermalene Searing and specialist for this bleeding problem. Dr Ermalene Searing needs to see pt to eval. Alvino Chapel scheduled appt 07/04/12 and if pt condition changes or worsens will take to ED. Alvino Chapel added [facility] nurse was out today vitals are good.

## 2012-07-04 ENCOUNTER — Encounter: Payer: Self-pay | Admitting: Family Medicine

## 2012-07-04 ENCOUNTER — Ambulatory Visit (INDEPENDENT_AMBULATORY_CARE_PROVIDER_SITE_OTHER): Payer: Medicare Other | Admitting: Family Medicine

## 2012-07-04 VITALS — BP 140/80 | HR 90 | Temp 97.5°F | Wt 109.5 lb

## 2012-07-04 DIAGNOSIS — R5381 Other malaise: Secondary | ICD-10-CM

## 2012-07-04 DIAGNOSIS — D509 Iron deficiency anemia, unspecified: Secondary | ICD-10-CM

## 2012-07-04 DIAGNOSIS — R5383 Other fatigue: Secondary | ICD-10-CM

## 2012-07-04 DIAGNOSIS — K5900 Constipation, unspecified: Secondary | ICD-10-CM

## 2012-07-04 DIAGNOSIS — K5909 Other constipation: Secondary | ICD-10-CM

## 2012-07-04 DIAGNOSIS — N949 Unspecified condition associated with female genital organs and menstrual cycle: Secondary | ICD-10-CM

## 2012-07-04 DIAGNOSIS — N938 Other specified abnormal uterine and vaginal bleeding: Secondary | ICD-10-CM

## 2012-07-04 DIAGNOSIS — K649 Unspecified hemorrhoids: Secondary | ICD-10-CM

## 2012-07-04 LAB — POCT HEMOGLOBIN: Hemoglobin: 10.9 g/dL — AB (ref 12.2–16.2)

## 2012-07-04 NOTE — Progress Notes (Signed)
Subjective:    Patient ID: Kristin Cantrell, female    DOB: 05-13-1921, 79 y.o.   MRN: [id number]  HPI  79 year old female with history of CAD, lung cancer, recent stroke and afib not on anticoagulation presents for possible rectal vs vaginal bleeding. She was hospitalized recently for weakness and slurred speech thought to be secondary to seizure activity on subtheraputic keppra.  Also treated for possible UTI in hospital.  She has been seen by Dr. Glade Lloyd at rehab.  Her daughter present. .She was seen in early June by Dr. Glade Lloyd who dx her with hemmorhoids due to straining with defecation causing fresh blood in her stool.  She was given anusol cream to treat. NO rectal/pelvic exam was done at that time. Per pt nurse saw blood come out of her rectum at that time.  She has had 6 months of vaginal spotting and was scheduled for a D and C by GYN Dr. Alvie Heidelberg which was cancelled due to family did not think pt could tolerate it.  She has hx of severe atrophic vaginitis.  On premarin cream followed by GYN. As per the daughter, since admission to hospital, her articulation has been imporving and she is clearing out mentally as well.   In entirety in last several weeks she thinks she has had rectal bleeding, now in last few days bleeding from rectum is daily and heavier... Clots, dark red blood, older appearing on pad.She is cold, fatigued. Blood ( 1-2 tbsp) in potty chair this AM ( not with BMs now but when sits down to urinate.) Last BM 2 days ago.. Chunks of stool, no blood.  No abdominal pain now, one episode prior to BM last week. No N/V, no  Diarrhea, hard stools.  No fever. No dysuria  Fatigue and weakness. Still some resolving issues with speech articulation.   Review of Systems  Constitutional: Positive for fatigue. Negative for fever.  HENT: Negative for ear pain.   Eyes: Negative for pain.  Respiratory: Positive for shortness of breath. Negative for chest tightness.    Cardiovascular: Negative for chest pain, palpitations and leg swelling.  Gastrointestinal: Negative for abdominal pain.  Genitourinary: Negative for dysuria.       Objective:   Physical Exam  Constitutional: Vital signs are normal. She appears well-developed and well-nourished. She is cooperative.  Non-toxic appearance. She does not appear ill. No distress.  Elderly lady in mild distress wearing oxygen Weak and cachectic  HENT:  Head: Normocephalic.  Right Ear: Hearing, tympanic membrane, external ear and ear canal normal. Tympanic membrane is not erythematous, not retracted and not bulging.  Left Ear: Hearing, tympanic membrane, external ear and ear canal normal. Tympanic membrane is not erythematous, not retracted and not bulging.  Nose: No mucosal edema or rhinorrhea. Right sinus exhibits no maxillary sinus tenderness and no frontal sinus tenderness. Left sinus exhibits no maxillary sinus tenderness and no frontal sinus tenderness.  Mouth/Throat: Uvula is midline, oropharynx is clear and moist and mucous membranes are normal.  Eyes: Conjunctivae, EOM and lids are normal. Pupils are equal, round, and reactive to light. No foreign bodies found.  Neck: Trachea normal and normal range of motion. Neck supple. Carotid bruit is not present. No mass and no thyromegaly present.  Cardiovascular: Normal rate, S1 normal, S2 normal, intact distal pulses and normal pulses.  An irregularly irregular rhythm present. Exam reveals distant heart sounds. Exam reveals no gallop and no friction rub.   No murmur heard. Pulmonary/Chest: Effort normal and breath  sounds normal. Not tachypneic. No respiratory distress. She has no decreased breath sounds. She has no wheezes. She has no rhonchi. She has no rales.  Abdominal: Soft. Normal appearance and bowel sounds are normal. There is no tenderness.  Genitourinary: Rectal exam shows internal hemorrhoid. Rectal exam shows no external hemorrhoid, no fissure, no mass,  no tenderness and anal tone normal. Guaiac negative stool. There is no rash, tenderness or lesion on the right labia. There is no rash, tenderness or lesion on the left labia. Uterus is not deviated, not enlarged, not fixed and not tender. Cervix exhibits no motion tenderness, no discharge and no friability. Right adnexum displays no mass, no tenderness and no fullness. Left adnexum displays no mass, no tenderness and no fullness. There is bleeding around the vagina. No erythema around the vagina. No signs of injury around the vagina. No vaginal discharge found.  Blood coming from cervix... No blood coming from rectum, small non bleeding hemmorhoids  Neurological: She is alert.  Skin: Skin is warm, dry and intact. No rash noted.  Psychiatric: Her speech is normal and behavior is normal. Judgment and thought content normal. Her mood appears not anxious. Cognition and memory are normal. She does not exhibit a depressed mood.          Assessment & Plan:

## 2012-07-04 NOTE — Patient Instructions (Addendum)
Hemoglobin was 10.8.  Keep appt with GYN. Start ferrous sulfate 1 tab daily.  Hold aspirin until bleeding slows down.  Go to ER if excessive bleeding or shortness of breath.  Increase water and fiber in diet for constipation. Can use mirilax to treat as well.

## 2012-07-10 DIAGNOSIS — N950 Postmenopausal bleeding: Secondary | ICD-10-CM | POA: Insufficient documentation

## 2012-07-10 DIAGNOSIS — K5909 Other constipation: Secondary | ICD-10-CM | POA: Insufficient documentation

## 2012-07-10 NOTE — Assessment & Plan Note (Signed)
INcrease fiber ,water and treat with mirilax daily.

## 2012-07-10 NOTE — Assessment & Plan Note (Signed)
Small current hemmorhoids.. Likely causing mild bleeding in past but none now. Treat with topical meds and treat constipation.

## 2012-07-10 NOTE — Assessment & Plan Note (Signed)
Bleeding is primarily coming from vaginal. I recommended returning to gyn for reconsideration of D and C given Hg has dropped significantly at this point. Risk benefit ratio has shifted.

## 2012-07-10 NOTE — Assessment & Plan Note (Signed)
Multifactorial 

## 2012-07-10 NOTE — Assessment & Plan Note (Signed)
Due to vaginal bleeding.  Start 1 tab iron deialy to avoid worsening constipation.

## 2012-07-15 ENCOUNTER — Other Ambulatory Visit: Payer: Self-pay | Admitting: Family Medicine

## 2012-08-01 ENCOUNTER — Ambulatory Visit: Payer: Self-pay | Admitting: Specialist

## 2012-08-05 ENCOUNTER — Other Ambulatory Visit: Payer: Self-pay | Admitting: Family Medicine

## 2012-08-07 ENCOUNTER — Encounter: Payer: Self-pay | Admitting: *Deleted

## 2012-08-07 NOTE — Telephone Encounter (Signed)
Opened in error, medication requested via surescript.

## 2012-08-17 ENCOUNTER — Telehealth: Payer: Self-pay | Admitting: *Deleted

## 2012-08-17 NOTE — Telephone Encounter (Signed)
Noted  

## 2012-08-17 NOTE — Telephone Encounter (Signed)
Physical therapist with [facility] called to report that pt has been recertified for home health services- physical therapy and speech therapy.

## 2012-08-22 DIAGNOSIS — R569 Unspecified convulsions: Secondary | ICD-10-CM

## 2012-08-22 DIAGNOSIS — I69998 Other sequelae following unspecified cerebrovascular disease: Secondary | ICD-10-CM

## 2012-08-22 DIAGNOSIS — R262 Difficulty in walking, not elsewhere classified: Secondary | ICD-10-CM

## 2012-08-22 DIAGNOSIS — Z5189 Encounter for other specified aftercare: Secondary | ICD-10-CM

## 2012-09-04 NOTE — Progress Notes (Signed)
This encounter was created in error - please disregard.

## 2012-09-10 ENCOUNTER — Other Ambulatory Visit: Payer: Self-pay | Admitting: Family Medicine

## 2012-09-12 NOTE — Telephone Encounter (Signed)
Received refill request electronically. Last office visit 07/04/12. Is it okay to refill medication?

## 2012-09-14 ENCOUNTER — Emergency Department: Payer: Self-pay | Admitting: Emergency Medicine

## 2012-09-14 LAB — CBC
HCT: 34.7 % — ABNORMAL LOW (ref 35.0–47.0)
HGB: 11.5 g/dL — ABNORMAL LOW (ref 12.0–16.0)
MCH: 29.1 pg (ref 26.0–34.0)
MCHC: 33.2 g/dL (ref 32.0–36.0)
MCV: 88 fL (ref 80–100)
Platelet: 243 10*3/uL (ref 150–440)
RBC: 3.95 10*6/uL (ref 3.80–5.20)
RDW: 15.2 % — ABNORMAL HIGH (ref 11.5–14.5)
WBC: 8.7 10*3/uL (ref 3.6–11.0)

## 2012-09-14 LAB — CK TOTAL AND CKMB (NOT AT ARMC): CK-MB: <0.5 ng/mL — ABNORMAL LOW (ref 0.5–3.6)

## 2012-09-14 LAB — COMPREHENSIVE METABOLIC PANEL
Albumin: 2.9 g/dL — ABNORMAL LOW (ref 3.4–5.0)
Alkaline Phosphatase: 82 U/L (ref 50–136)
Bilirubin,Total: 0.3 mg/dL (ref 0.2–1.0)
Chloride: 107 mmol/L (ref 98–107)
Co2: 26 mmol/L (ref 21–32)
EGFR (African American): >60
EGFR (Non-African Amer.): 55 — ABNORMAL LOW
Glucose: 106 mg/dL — ABNORMAL HIGH (ref 65–99)
Osmolality: 281 (ref 275–301)
Potassium: 3.7 mmol/L (ref 3.5–5.1)
SGOT(AST): 13 U/L — ABNORMAL LOW (ref 15–37)
Sodium: 140 mmol/L (ref 136–145)
Total Protein: 6.1 g/dL — ABNORMAL LOW (ref 6.4–8.2)

## 2012-09-19 ENCOUNTER — Telehealth: Payer: Self-pay

## 2012-09-19 NOTE — Telephone Encounter (Signed)
Alvino Chapel left v/m to let Dr Ermalene Searing know that pt was admitted to hospital on 09/14/12; looked at CT in 07/2012 indicated that her cancer was coming back. Pt running slight fever; Alvino Chapel has multiple questions; what should do if fever shoots up and request cb for other questions. Pt was discharged from hospital; pt is fearful that she has a fever and Alvino Chapel said she does not have fever.  Pt is taking antibiotic for 7 days.Alvino Chapel said pts lung is shutting down. Alvino Chapel does not know what to do. What would Dr Ermalene Searing suggest if fever spikes back up? Is there anything else that could be done.Does not want to go to hospital and does not want to come in office for appt. Alvino Chapel will wait on call back. Alvino Chapel also said pt is taking Keppra 1000 mg. For seizures. Alvino Chapel wants to know if next time pt needs refills of Keppra can more than one refill be given?

## 2012-09-20 NOTE — Telephone Encounter (Signed)
Kristin Cantrell. Discussed in detail with her about concerns.  We will refer her to hospice for on call hospice RN comfort care. Daughter agrees fully.  Please notify Alvino Chapel, daughter when this is set up.

## 2012-09-20 NOTE — Telephone Encounter (Signed)
Called [facility] faxed referral to Amil Amen, they will call Alvino Chapel the daughter today to set up.

## 2012-10-03 DIAGNOSIS — Z8673 Personal history of transient ischemic attack (TIA), and cerebral infarction without residual deficits: Secondary | ICD-10-CM

## 2012-10-03 DIAGNOSIS — J449 Chronic obstructive pulmonary disease, unspecified: Secondary | ICD-10-CM

## 2012-10-03 DIAGNOSIS — C3490 Malignant neoplasm of unspecified part of unspecified bronchus or lung: Secondary | ICD-10-CM

## 2012-10-03 DIAGNOSIS — R634 Abnormal weight loss: Secondary | ICD-10-CM

## 2012-10-09 ENCOUNTER — Other Ambulatory Visit: Payer: Self-pay | Admitting: Family Medicine

## 2012-10-09 NOTE — Telephone Encounter (Signed)
Last OV 07/04/2012.  Ok to refill?

## 2012-10-10 ENCOUNTER — Telehealth: Payer: Self-pay

## 2012-10-10 NOTE — Telephone Encounter (Signed)
Verbal order given to [facility] with Hospice as instructed by Dr. Ermalene Searing.

## 2012-10-10 NOTE — Telephone Encounter (Signed)
Okay to give verbal order as requested as long as pt does not have any typical contraindications as listed on flu vaccine form ( the questions you usually ask)

## 2012-10-10 NOTE — Telephone Encounter (Signed)
Left message for Stephanie to return my call. 

## 2012-10-10 NOTE — Telephone Encounter (Signed)
Stephanie with [facility] left v/m requesting verbal order for flu vaccine.Please advise.

## 2012-10-19 ENCOUNTER — Telehealth: Payer: Self-pay

## 2012-10-22 IMAGING — CT CT HEAD WITHOUT CONTRAST
2 series · 15 of 30 positions shown, 19 images · non-contrast
Comparison: none

REASON FOR EXAM: falls,  brusing to head - doesn't recall fall events, hx
of CVA, likely seizure
COMMENTS:

[Series 2: without · axial · non-contrast · 0.43mm/px · z∈[+338,+472]mm · 13 of 33 slices shown, 17 images]
[im 3/33  brain]
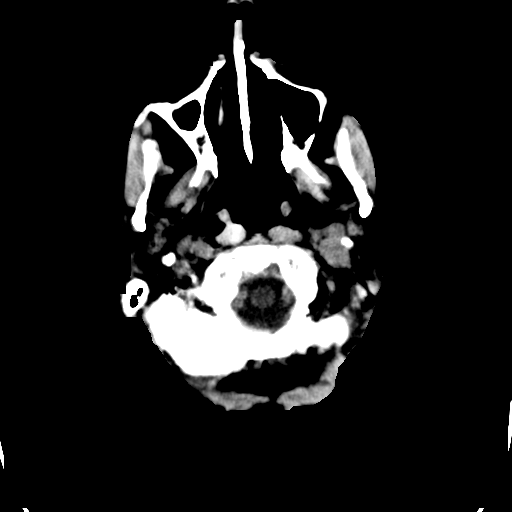
[im 3/33  bone]
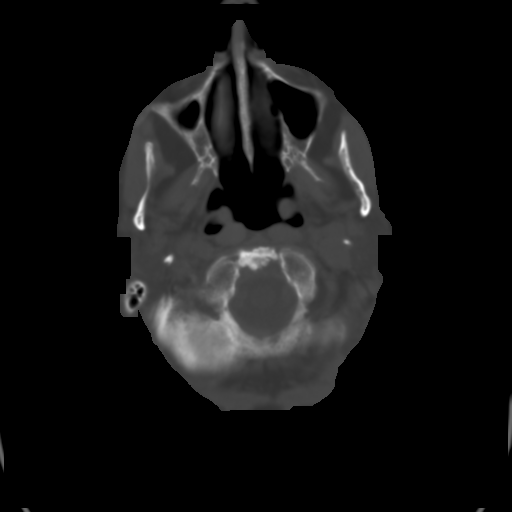
[im 5/33  brain]
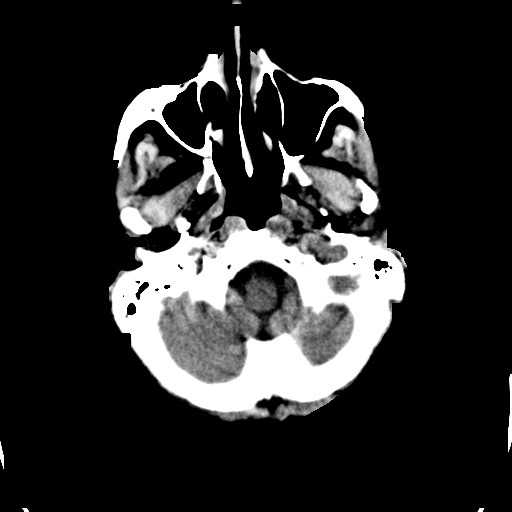
[im 7/33  brain]
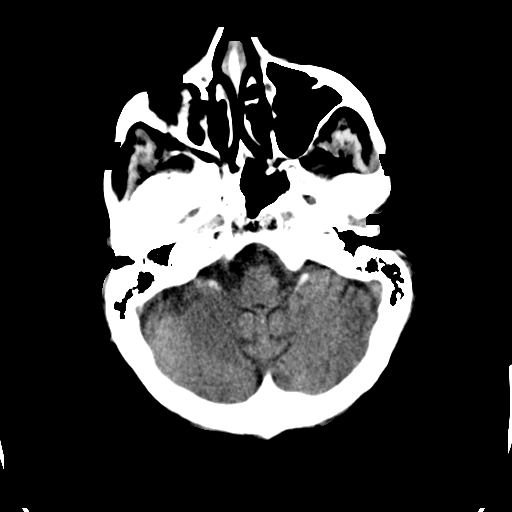
[im 10/33  brain]
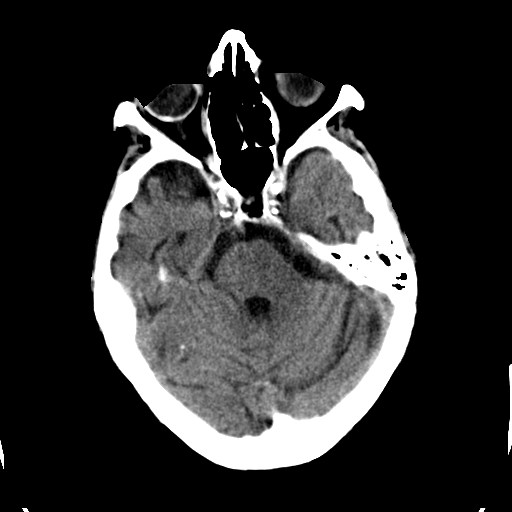
[im 12/33  brain]
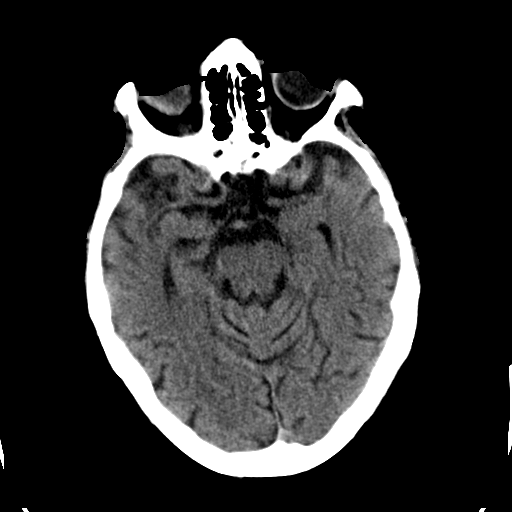
[im 12/33  bone]
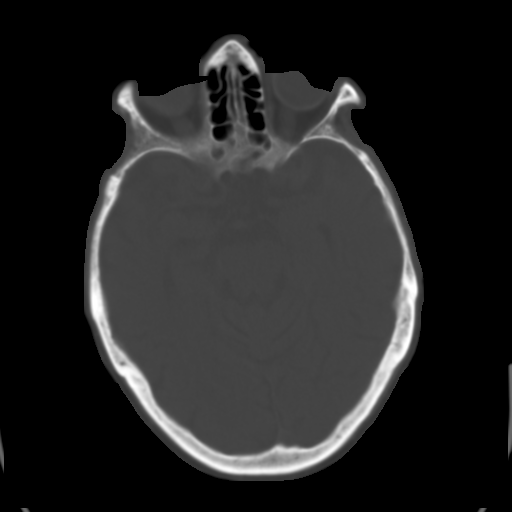
[im 14/33  brain]
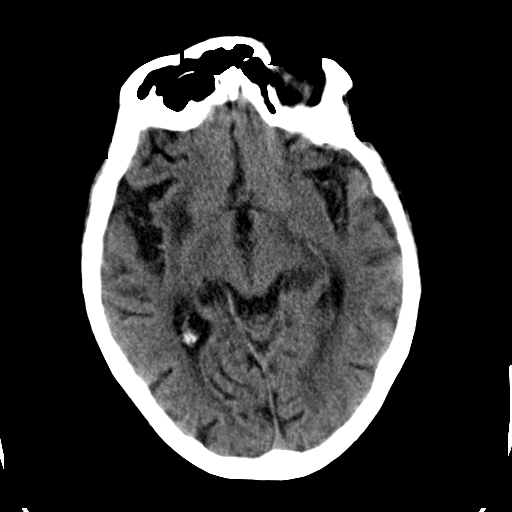
[im 17/33  brain]
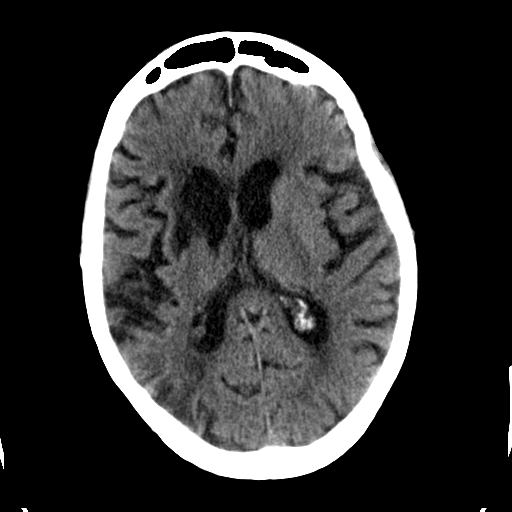
[im 19/33  brain]
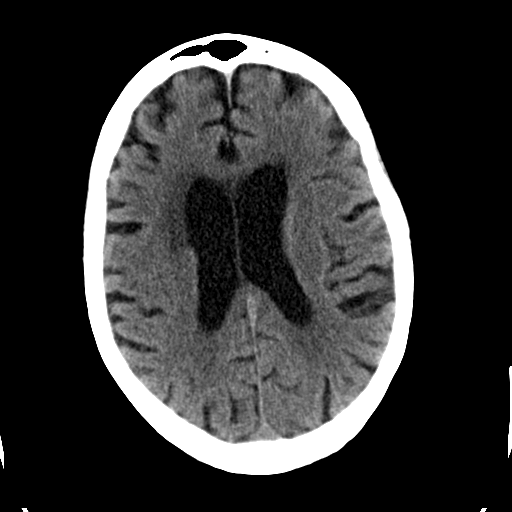
[im 21/33  brain]
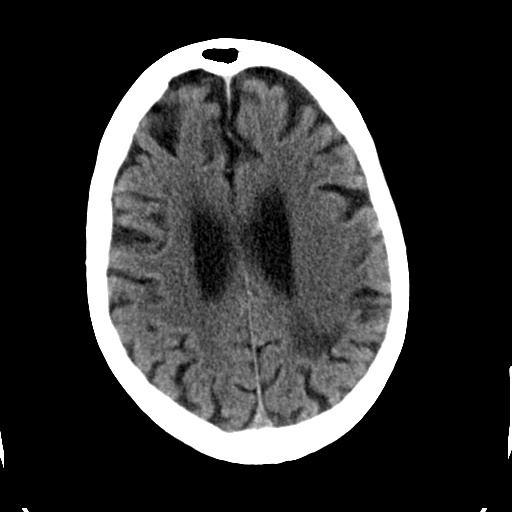
[im 21/33  bone]
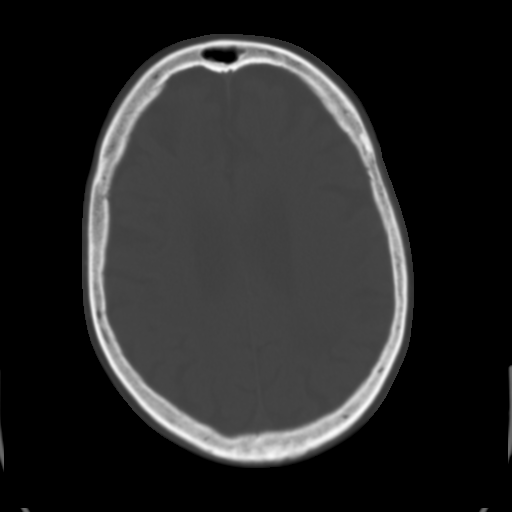
[im 23/33  brain]
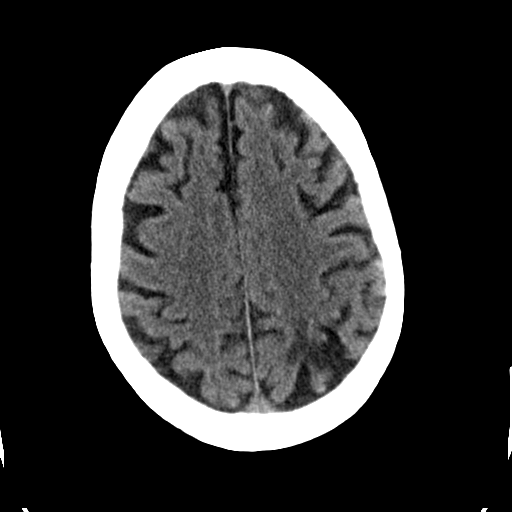
[im 26/33  brain]
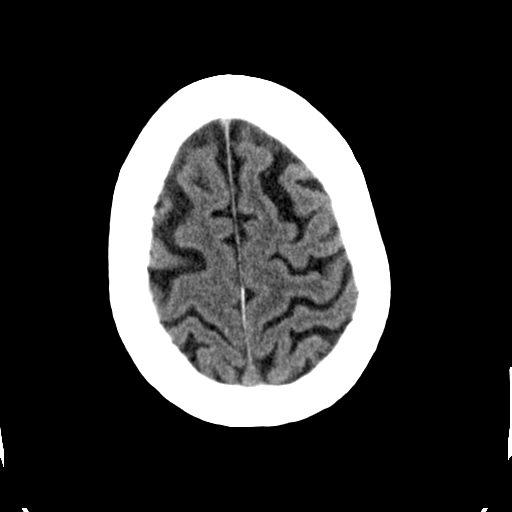
[im 28/33  brain]
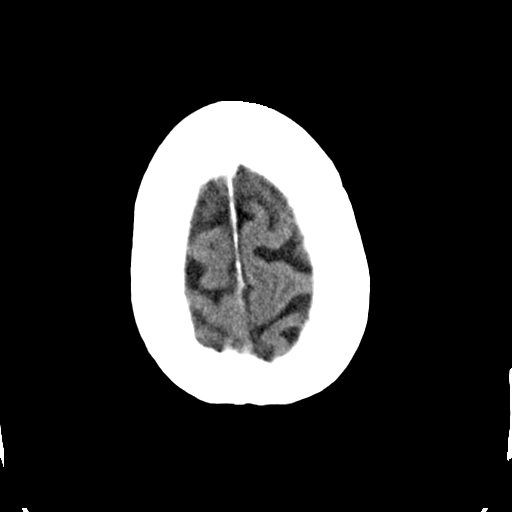
[im 30/33  brain]
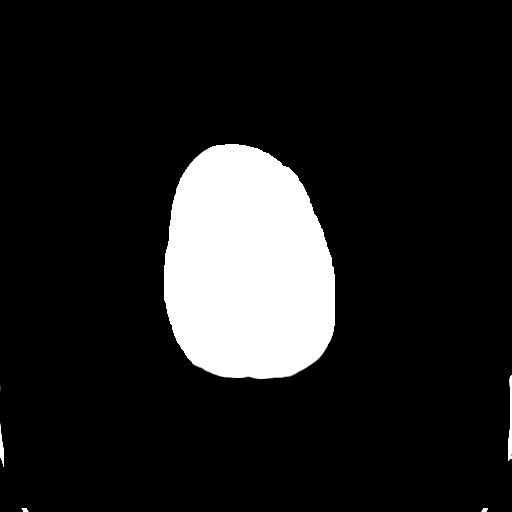
[im 30/33  bone]
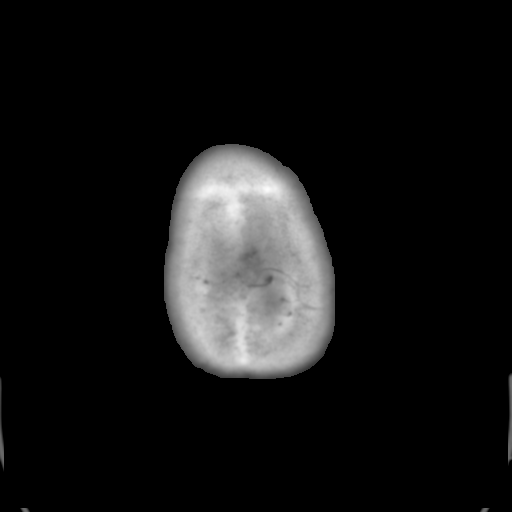

[Series 3: bone · axial · 0.43mm/px · z∈[+338,+358]mm · 2 of 33 slices shown]
[im 3/33  bone]
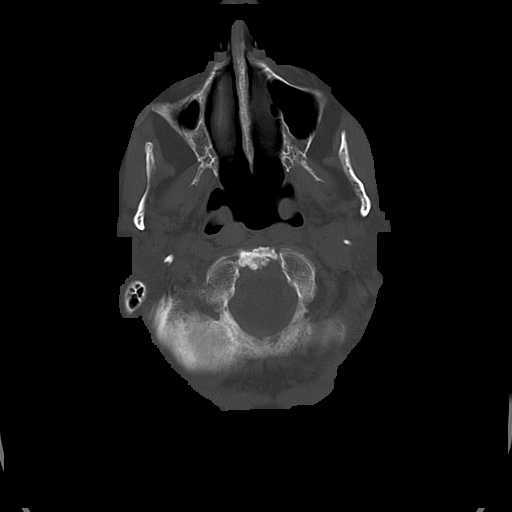
[im 7/33  bone]
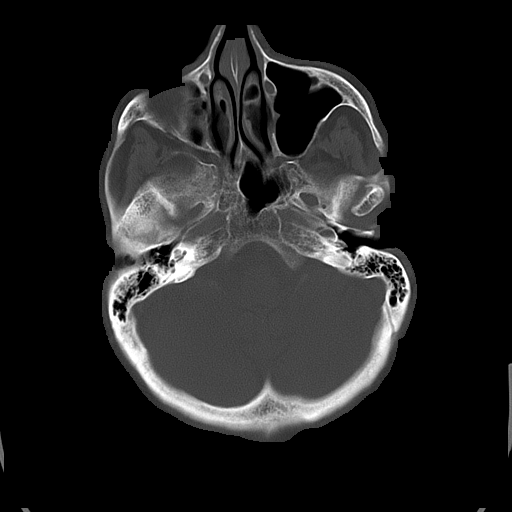

[15 of 30 positions shown; findings below may reference images not displayed]

PROCEDURE:     CT  - CT HEAD WITHOUT CONTRAST  - August 03, 2011  [DATE]

RESULT:     Axial CT scanning was performed through the brain with
reconstructions at 5 mm intervals and slice thicknesses.

There is moderate diffuse cerebral and cerebellar atrophy with compensatory
ventriculomegaly. Encephalomalacia in the right basal ganglia is consistent
with previous lacunar infarctions. There is decreased density in the deep
white matter of the left posterior parietal lobe consistent with chronic
small vessel ischemic type change. There is no evidence of an acute
intracranial hemorrhage nor of an acute evolving ischemic infarction. The
cerebellum and brainstem exhibit no acute abnormality.

At bone window settings there is no evidence of an acute skull fracture. The
observed portions of the paranasal sinuses and mastoid air cells are clear.
IMPRESSION: 1. There are chronic changes within the brain consistent with previous
ischemic insult and small vessel ischemic type change of aging.
2. There is no evidence of an acute ischemic or hemorrhagic infarction.
3. There is no hydrocephalus nor intracranial mass effect.

## 2012-11-11 NOTE — Telephone Encounter (Signed)
Kristin Cantrell with [facility] left v/m that pt died today 2012-11-01 at 1:27 pm.

## 2012-11-11 DEATH — deceased

## 2014-04-30 NOTE — Discharge Summary (Signed)
PATIENT NAME:  Kristin Cantrell, Kristin Cantrell MR#:  [id number] DATE OF BIRTH:  [birth year]  DATE OF ADMISSION:  08/03/2011 DATE OF DISCHARGE:  08/06/2011  PRIMARY CARE PHYSICIAN: Eliezer Lofts, MD  DISCHARGE DIAGNOSES:  1. Chronic obstructive pulmonary disease exacerbation with chronic respiratory failure. 2. Pneumonia.  3. Weakness.  4. Fall. 5. Hypertension.  6. Hyperlipidemia.  7. Seizure disorder.   HOME MEDICATIONS: Please refer to the admission [facility] physician discharge instructions. New Medications:  1. Levaquin 250 mg p.o. daily for four days.  2. Prednisone 20 mg p.o. daily, then taper every two days for a total of six days.  3. DuoNeb nebulizers every four hours p.r.n.   DIET: Low sodium diet.   ACTIVITY: As tolerated.   FOLLOW-UP CARE: Follow up with primary care physician within 1 to 2 weeks and continue physical therapy.   REASON FOR ADMISSION: Fever.   HOSPITAL COURSE: The patient is an 81 year old Caucasian female with a history of CVA, seizure, lung cancer status post radiation treatment to the right upper lobe. She presented to the ED with fever, falls, and weakness. She could not walk well. In addition, the patient has cough. In the ED, chest x-ray showed pneumonia and the patient was admitted to the medical floor for further evaluation. For detailed history and physical examination, please refer to the admission note dictated by Dr. Loletha Grayer.   On admission white count was 9.4 and hemoglobin 12. BUN was 16 and creatinine 0.95. Electrolytes were normal. Urinalysis was negative   The patient was admitted for chronic obstructive pulmonary disease exacerbation and pneumonia. After admission the patient has been treated with Solu-Medrol IVPB and Levaquin IVPB. In addition, the patient has been treated with nebulizer p.r.n. For falls and weakness, the patient has been treated with physical therapy The patient's symptoms have much improved. She has no complaints. In addition, since  the patient is living alone and the patient has weakness and recurrent falls, the patient needs subacute rehab. The patient is clinically stable and will be discharged to a nursing home today. I discussed the patient's situation and discharge plan with the patient, the patient's daughter, nurse and, case Freight forwarder.   TIME SPENT: About 37 minutes. ____________________________ Demetrios Loll, MD qc:slb D: 08/06/2011 12:28:23 ET T: 08/06/2011 13:05:44 ET JOB#: [id number]  cc: Demetrios Loll, MD, <Dictator> Jinny Sanders, MD Demetrios Loll MD ELECTRONICALLY SIGNED 08/06/2011 22:17

## 2014-04-30 NOTE — H&P (Signed)
PATIENT NAME:  Kristin Cantrell, Kristin Cantrell MR#:  [id number] DATE OF BIRTH:  Feb 22, 1921  DATE OF ADMISSION:  08/03/2011  PRIMARY CARE PHYSICIAN: Eliezer Lofts, MD   CHIEF COMPLAINT: Fever.   HISTORY OF PRESENT ILLNESS: This is an 82 year old female with history of CVA, seizure, and lung cancer status post radiation treatment to the right upper lobe. She presents with fever, falls, and weakness. Last Tuesday she had a fall and hit her head and has a bruise between her eyes. She does not recall how she fell. She also had another fall on Sunday. This Friday she had a terrible day. She took a new med, Remeron, to try to increase appetite. Thursday night she felt very groggy all day and was mumbling and couldn't walk well, got better as the day went on. She has been coughing and it's been very painful on the right side of her breast. In the ER she had a chest x-ray and the ER physician felt there was a pneumonia and hospitalist services were contacted for further evaluation.   PAST MEDICAL HISTORY:  1. Cerebrovascular accident. 2. Seizure.  3. Lung cancer, status post radiation to the right upper lobe.  4. Hypertension.  5. Hyperlipidemia.  6. Hypothyroidism.  7. Macular degeneration.   PAST SURGICAL HISTORY:  1. Hip fracture.  2. Cataracts.  3. Pacer.   ALLERGIES: Codeine.   MEDICATIONS:  1. Simvastatin 40 mg daily.  2. Septra, low dose, every other day. 3. Restasis eyedrops one drop twice a day each eye.  4. Levothyroxine 25 mcg daily.  5. Keppra 750 mg twice a day.  6. Imdur 30 mg 1 tablet daily.  7. Atenolol 12.5 mg twice a day. 8. Aspirin 81 mg twice a day.   SOCIAL HISTORY: Quit smoking in 1981. No alcohol. No drug use. Was a housewife. Currently lives alone.   FAMILY HISTORY: Father died of an MI. Mother had colon cancer, breast cancer, atrial fibrillation, and stroke. Brother died at 22 of an MI.  REVIEW OF SYSTEMS: CONSTITUTIONAL: Positive for fever. Positive for weight loss, 10 pounds  over the past six months. Positive for fatigue. EYES: She does wear glasses and has cataracts. EARS, NOSE, MOUTH, AND THROAT: Decreased hearing. Positive for runny nose. Positive for postnasal drip. No sore throat. No difficulty swallowing. CARDIOVASCULAR: No chest pain but pain with coughing on the right side. RESPIRATORY: Positive for shortness of breath. Positive cough, clear phlegm. No sputum. No hemoptysis. GASTROINTESTINAL: Positive for nausea. No abdominal pain. Positive for constipation. No bright red blood per rectum. No melena. GENITOURINARY: No burning on urination. No hematuria. MUSCULOSKELETAL: Feeling sore with the falls. INTEGUMENTARY: Positive for bruising between the eyes and left knee. PSYCHIATRIC: No anxiety. NEUROLOGICAL: Questionable fainting. ENDOCRINE: Positive for hypothyroidism. HEMATOLOGIC/LYMPHATIC: No anemia.   PHYSICAL EXAMINATION:   VITAL SIGNS: Temperature 98.7, pulse 68, respirations 20, blood pressure 129/68, pulse oximetry 94%.   GENERAL: No respiratory distress, lying flat in bed.   EYES: Conjunctivae and lids normal. Pupils equal, round, and reactive to light. Extraocular muscles intact. No nystagmus.   EARS, NOSE, MOUTH, AND THROAT: Tympanic membrane no erythema. Nasal mucosa no erythema. Throat no erythema. No exudate seen. Lips and gums no lesions.   NECK: No JVD. No bruits. No lymphadenopathy. No thyromegaly. No thyroid nodules palpated.   RESPIRATORY: Decreased breath sounds bilaterally. Positive wheeze throughout entire lung field. No use of accessory muscles to breathe.   CARDIOVASCULAR: S1, S2 normal. No gallops, rubs, or murmurs heard. Carotid upstroke 2+  bilaterally. No bruits.   EXTREMITIES: Dorsalis pedis pulses 1+ bilaterally. No edema of the lower extremity.   ABDOMEN: Soft. Slight tenderness over the bladder. No organomegaly/splenomegaly. Normoactive bowel sounds. No masses felt.   LYMPHATIC: No lymph nodes in the neck.   MUSCULOSKELETAL: No  clubbing, edema, or cyanosis on oxygen.   SKIN: No ulcers or lesions seen. Bruising slightly between the eyes and the left knee.   NEUROLOGIC: Cranial nerves II through XII grossly intact. Deep tendon reflexes 1+ bilateral lower extremity. Facial twitching noted on the left. Sensation grossly intact to light touch. Power 5 out of 5 bilateral upper and lower extremities.   PSYCHIATRIC: The patient is oriented to person, place, and time.   LABORATORY AND RADIOLOGICAL DATA: CT scan of the head showed chronic changes within the brain. No acute infarct.   White blood cell count 9.4, hemoglobin and hematocrit 12.0 and 36.7, platelet count 153, glucose 113, BUN 16, creatinine 0.95, sodium 141, potassium 4.0, chloride 106, CO2 30, calcium 8.4, anion gap 5. Urinalysis 1+ blood.   EKG paced, atrial fibrillation.   ASSESSMENT AND PLAN:  1. Chronic obstructive pulmonary disease exacerbation. ER physician thought there was pneumonia. Will wait for official report. I'm not so sure there is. The patient does have chronic respiratory failure. Will add Solu-Medrol 40 mg IV q.8 hours, Levaquin 500 mg IV daily, and nebulizer treatments.  2. Falls and weakness. Will get physical therapy consultation. Further imaging will depend on how she does with physical therapy. May end up needing rehab depending on how she does with PT.  3. Seizure. Continue Keppra.  4. History of lung cancer. Follow-up as outpatient with Oncology.  5. Hypertension. Blood pressure currently controlled on atenolol.  6. Hyperlipidemia. On Zocor.  7. Hypothyroidism. On levothyroxine, will continue.   TIME SPENT ON ADMISSION: 55 minutes.   CODE STATUS: The patient is a FULL CODE.   ____________________________ Tana Conch. Leslye Peer, MD rjw:drc D: 08/03/2011 21:32:05 ET T: 08/04/2011 07:22:24 ET JOB#: [id number]  cc: Tana Conch. Leslye Peer, MD, <Dictator> Jinny Sanders, MD Marisue Brooklyn MD ELECTRONICALLY SIGNED 08/04/2011 13:08

## 2014-05-03 NOTE — Op Note (Signed)
PATIENT NAME:  Kristin Cantrell, Kristin Cantrell MR#:  [id number] DATE OF BIRTH:  [birth year]  DATE OF PROCEDURE:  04/18/2012  PREOPERATIVE DIAGNOSIS: Visually significant cataract of the right eye.   POSTOPERATIVE DIAGNOSIS: Visually significant cataract of the right eye.   OPERATIVE PROCEDURE: Cataract extraction by phacoemulsification with implant of intraocular lens to right eye.   SURGEON: Birder Robson, MD.   ANESTHESIA:  1. Managed anesthesia care.  2. Topical tetracaine drops followed by 2% Xylocaine jelly applied in the preoperative holding area.   COMPLICATIONS: None.   TECHNIQUE:  Stop and chop.   DESCRIPTION OF PROCEDURE: The patient was examined and consented in the preoperative holding area where the aforementioned topical anesthesia was applied to the right eye and then brought back to the Operating Room where the right eye was prepped and draped in the usual sterile ophthalmic fashion and a lid speculum was placed. A paracentesis was created with the side port blade and the anterior chamber was filled with viscoelastic. A near clear corneal incision was performed with the steel keratome. A continuous curvilinear capsulorrhexis was performed with a cystotome followed by the capsulorrhexis forceps. Hydrodissection and hydrodelineation were carried out with BSS on a blunt cannula. The lens was removed in a stop and chop  technique and the remaining cortical material was removed with the irrigation-aspiration handpiece. The capsular bag was inflated with viscoelastic and the ZCB00 25.5 diopter lens, serial number [id number] was placed in the capsular bag without complication. The remaining viscoelastic was removed from the eye with the irrigation-aspiration handpiece. The wounds were hydrated. The anterior chamber was flushed with Miostat and the eye was inflated to physiologic pressure. 0.1 mL of cefuroxime concentration 10 mg/mL was placed in the anterior chamber. The wounds were found to be water  tight. The eye was dressed with Vigamox. The patient was given protective glasses to wear throughout the day and a shield with which to sleep tonight. The patient was also given drops with which to begin a drop regimen today and will follow-up with me in one day.      ____________________________ Kristin Cantrell. Nataly Pacifico, MD wlp:OSi D: 04/18/2012 12:37:00 ET T: 04/18/2012 13:30:30 ET JOB#: [id number]  cc: Pratt Bress L. Denetria Luevanos, MD, <Dictator> Kristin Cantrell Tamarcus Condie MD ELECTRONICALLY SIGNED 04/25/2012 12:51
